# Patient Record
Sex: Female | Born: 1960 | Race: White | Hispanic: No | Marital: Single | Smoking: Former smoker
Health system: Southern US, Community
[De-identification: ages and names within clinical notes are randomized; demographics above are authoritative.]

## PROBLEM LIST (undated history)

## (undated) DIAGNOSIS — F419 Anxiety disorder, unspecified: Secondary | ICD-10-CM

## (undated) DIAGNOSIS — T7840XA Allergy, unspecified, initial encounter: Secondary | ICD-10-CM

## (undated) DIAGNOSIS — J069 Acute upper respiratory infection, unspecified: Secondary | ICD-10-CM

## (undated) HISTORY — DX: Anxiety disorder, unspecified: F41.9

## (undated) HISTORY — DX: Allergy, unspecified, initial encounter: T78.40XA

## (undated) HISTORY — PX: NASAL SINUS SURGERY: SHX719

## (undated) HISTORY — DX: Acute upper respiratory infection, unspecified: J06.9

---

## 1998-09-12 ENCOUNTER — Ambulatory Visit (HOSPITAL_COMMUNITY): Admission: RE | Admit: 1998-09-12 | Discharge: 1998-09-12 | Payer: Self-pay | Admitting: *Deleted

## 1999-10-24 ENCOUNTER — Other Ambulatory Visit: Admission: RE | Admit: 1999-10-24 | Discharge: 1999-10-24 | Payer: Self-pay | Admitting: Obstetrics and Gynecology

## 2001-03-15 ENCOUNTER — Other Ambulatory Visit: Admission: RE | Admit: 2001-03-15 | Discharge: 2001-03-15 | Payer: Self-pay | Admitting: Obstetrics and Gynecology

## 2012-07-28 ENCOUNTER — Encounter (HOSPITAL_BASED_OUTPATIENT_CLINIC_OR_DEPARTMENT_OTHER): Payer: Self-pay | Admitting: *Deleted

## 2012-07-28 ENCOUNTER — Emergency Department (HOSPITAL_BASED_OUTPATIENT_CLINIC_OR_DEPARTMENT_OTHER)
Admission: EM | Admit: 2012-07-28 | Discharge: 2012-07-29 | Disposition: A | Discharge status: Home / Self Care | Payer: BC Managed Care – PPO | Attending: Emergency Medicine | Admitting: Emergency Medicine

## 2012-07-28 DIAGNOSIS — F17200 Nicotine dependence, unspecified, uncomplicated: Secondary | ICD-10-CM | POA: Insufficient documentation

## 2012-07-28 DIAGNOSIS — IMO0001 Reserved for inherently not codable concepts without codable children: Secondary | ICD-10-CM | POA: Insufficient documentation

## 2012-07-28 DIAGNOSIS — J209 Acute bronchitis, unspecified: Secondary | ICD-10-CM

## 2012-07-28 NOTE — ED Notes (Signed)
Pt c/o URi symptoms x 1 week  

## 2012-07-29 ENCOUNTER — Emergency Department (HOSPITAL_BASED_OUTPATIENT_CLINIC_OR_DEPARTMENT_OTHER): Payer: BC Managed Care – PPO

## 2012-07-29 MED ORDER — AZITHROMYCIN 250 MG PO TABS: 250.0000 mg | ORAL_TABLET | Freq: Every day | ORAL | Status: DC

## 2012-07-29 MED ORDER — HYDROCOD POLST-CHLORPHEN POLST 10-8 MG/5ML PO LQCR: 5.0000 mL | Freq: Two times a day (BID) | ORAL | Status: DC | PRN

## 2012-07-29 NOTE — ED Provider Notes (Signed)
History     CSN: [id number]  Arrival date & time 07/28/12  2329   First MD Initiated Contact with Patient 07/29/12 0034      Chief Complaint  Patient presents with  . URI    (Consider location/radiation/quality/duration/timing/severity/associated sxs/prior treatment) Patient is a 52 y.o. female presenting with URI. The history is provided by the patient.  URI The primary symptoms include fatigue, ear pain, cough and myalgias. Primary symptoms do not include fever. The current episode started more than 1 week ago. This is a new problem. The problem has been gradually worsening.  The onset of the illness is associated with exposure to sick contacts (Patient is a Engineer, site with lots of students ill). Risk factors: quit smoking three weeks ago.    History reviewed. No pertinent past medical history.  Past Surgical History  Procedure Date  . Cesarean section     History reviewed. No pertinent family history.  History  Substance Use Topics  . Smoking status: Current Every Day Smoker  . Smokeless tobacco: Not on file  . Alcohol Use: No    OB History    Grav Para Term Preterm Abortions TAB SAB Ect Mult Living                  Review of Systems  Constitutional: Positive for fatigue. Negative for fever.  HENT: Positive for ear pain.   Respiratory: Positive for cough.   Musculoskeletal: Positive for myalgias.  All other systems reviewed and are negative.    Allergies  Review of patient's allergies indicates no known allergies.  Home Medications  No current outpatient prescriptions on file.  BP 137/81  Pulse 88  Temp 98.1 F (36.7 C)  Resp 16  Ht 4\' 10"  (1.473 m)  Wt 98 lb (44.453 kg)  BMI 20.48 kg/m2  SpO2 100%  Physical Exam  Nursing note and vitals reviewed. Constitutional: She is oriented to person, place, and time. She appears well-developed and well-nourished. No distress.  HENT:  Head: Normocephalic and atraumatic.  Mouth/Throat: Oropharynx is  clear and moist.       Bilateral tm's are clear.  Neck: Normal range of motion. Neck supple.  Cardiovascular: Normal rate and regular rhythm.  Exam reveals no gallop and no friction rub.   No murmur heard. Pulmonary/Chest: Effort normal and breath sounds normal. No respiratory distress. She has no wheezes.  Abdominal: Soft. Bowel sounds are normal. She exhibits no distension. There is no tenderness.  Musculoskeletal: Normal range of motion.  Neurological: She is alert and oriented to person, place, and time.  Skin: Skin is warm and dry. She is not diaphoretic.    ED Course  Procedures (including critical care time)  Labs Reviewed - No data to display No results found.   No diagnosis found.    MDM  The patient has been sick for over a week and is getting worse with productive cough, fevers, and shortness of breath.  She is a smoker who stopped weeks ago.  The chest xray is not suggestive of pneumonia.  She will be given cough syrup and zithromax for what sounds like bronchitis.  She is to return prn for any problems.  She was advised to continue to refrain from smoking.        Geoffery Lyons, MD 07/29/12 [phone]

## 2012-07-29 NOTE — ED Notes (Signed)
MD at bedside giving test results and plan of care. 

## 2013-01-25 ENCOUNTER — Ambulatory Visit (INDEPENDENT_AMBULATORY_CARE_PROVIDER_SITE_OTHER): Payer: BC Managed Care – PPO | Admitting: Physician Assistant

## 2013-01-25 VITALS — BP 86/70 | HR 59 | Temp 98.1°F | Resp 18 | Wt 107.0 lb

## 2013-01-25 DIAGNOSIS — H6983 Other specified disorders of Eustachian tube, bilateral: Secondary | ICD-10-CM

## 2013-01-25 DIAGNOSIS — H6980 Other specified disorders of Eustachian tube, unspecified ear: Secondary | ICD-10-CM

## 2013-01-25 DIAGNOSIS — H9209 Otalgia, unspecified ear: Secondary | ICD-10-CM

## 2013-01-25 DIAGNOSIS — H6993 Unspecified Eustachian tube disorder, bilateral: Secondary | ICD-10-CM

## 2013-01-25 DIAGNOSIS — H9203 Otalgia, bilateral: Secondary | ICD-10-CM

## 2013-01-25 DIAGNOSIS — H6590 Unspecified nonsuppurative otitis media, unspecified ear: Secondary | ICD-10-CM

## 2013-01-25 MED ORDER — PREDNISONE 20 MG PO TABS
ORAL_TABLET | ORAL | Status: DC
Start: 2013-01-25 — End: 2013-10-12

## 2013-01-25 MED ORDER — BENZONATATE 100 MG PO CAPS: 100.0000 mg | ORAL_CAPSULE | Freq: Three times a day (TID) | ORAL | Status: DC | PRN

## 2013-01-25 MED ORDER — AMOXICILLIN 875 MG PO TABS: 875.0000 mg | ORAL_TABLET | Freq: Two times a day (BID) | ORAL | Status: DC

## 2013-01-25 MED ORDER — IPRATROPIUM BROMIDE 0.06 % NA SOLN: 2.0000 | Freq: Three times a day (TID) | NASAL | Status: DC

## 2013-01-25 NOTE — Progress Notes (Signed)
   Patient ID: Jessica Bailey MRN: [id number], DOB: [birth year], 52 y.o. Date of Encounter: 01/25/2013, 1:27 PM  Primary Physician: Marisue Brooklyn, DO  Chief Complaint: URI x 1 week  HPI: 52 y.o. female with history below presents with URI symptoms x 1 week. Patient was at the beach 1 week ago and playing in the ocean a lot. Since then she feel like she has "a brick" in her ears. Increasing fluid in her ears. Nasal congestion, post nasal drip, and non productive cough from the drainage. Some sinus pressure. Afebrile. Some dizziness. Has tried Claritin and pseudofed without relief.    Past Medical History  Diagnosis Date  . Allergy      Home Meds: Prior to Admission medications   Medication Sig Start Date End Date Taking? Authorizing Provider                                Allergies:  Allergies  Allergen Reactions  . Codeine     History   Social History  . Marital Status: Married    Spouse Name: N/A    Number of Children: N/A  . Years of Education: N/A   Occupational History  . Not on file.   Social History Main Topics  . Smoking status: Former Games developer  . Smokeless tobacco: Not on file  . Alcohol Use: Yes  . Drug Use: No  . Sexually Active: Yes    Birth Control/ Protection: None   Other Topics Concern  . Not on file   Social History Narrative  . No narrative on file     Review of Systems: Constitutional: negative for chills, fever, or fatigue  HEENT: see above Cardiovascular: negative for chest pain or palpitations Respiratory: positive for cough. negative for wheezing, shortness of breath, or cough Abdominal: negative for abdominal pain, nausea, vomiting, or diarrhea Dermatological: negative for rash Neurologic: positive for headache and dizziness. Negative for syncope   Physical Exam: Blood pressure 86/70, pulse 59, temperature 98.1 F (36.7 C), temperature source Oral, resp. rate 18, weight 107 lb (48.535 kg), SpO2 100.00%., Body mass index is  22.37 kg/(m^2). General: Well developed, well nourished, in no acute distress. Head: Normocephalic, atraumatic, eyes without discharge, sclera non-icteric, nares are without discharge. Bilateral auditory canals clear, TM's are with increased serous fluid and air fluid levels. No perforation. Tragus TTP. Oral cavity moist, posterior pharynx without exudate, erythema, peritonsillar abscess, or post nasal drip.  Neck: Supple. No thyromegaly. Full ROM. No lymphadenopathy. Lungs: Clear bilaterally to auscultation without wheezes, rales, or rhonchi. Breathing is unlabored. Heart: RRR with S1 S2. No murmurs, rubs, or gallops appreciated. Msk:  Strength and tone normal for age. Extremities/Skin: Warm and dry. No clubbing or cyanosis. No edema. No rashes or suspicious lesions. Neuro: Alert and oriented X 3. Moves all extremities spontaneously. Gait is normal. CNII-XII grossly in tact. Psych:  Responds to questions appropriately with a normal affect.     ASSESSMENT AND PLAN:  52 y.o. female with bilateral otitis media, otalgia, and ETD -Amoxicillin 875 mg 1 po bid #20 no RF -Atrovent NS 0.06% 2 sprays each nare bid prn #1 no RF -Prednisone 20 mg #18 3x3, 2x3, 1x3 no RF -Tessalon Perles 100 mg 1 po tid prn cough #40 no RF    Signed, Eula Listen, PA-C 01/25/2013 1:27 PM

## 2013-01-25 NOTE — Patient Instructions (Addendum)
Barotitis Media Barotitis media is soreness (inflammation) of the area behind the eardrum (middle ear). This occurs when the auditory tube (Eustachian tube) leading from the back of the throat to the eardrum is blocked. When it is blocked air cannot move in and out of the middle ear to equalize pressure changes. These pressure changes come from changes in altitude when:  Flying.  Driving in the mountains.  Diving. Problems are more likely to occur with pressure changes during times when you are congested as from:  Hay fever.  Upper respiratory infection.  A cold. Damage or hearing loss (barotrauma) caused by this may be permanent. HOME CARE INSTRUCTIONS   Use medicines as recommended by your caregiver. Over the counter medicines will help unblock the canal and can help during times of air travel.  Do not put anything into your ears to clean or unplug them. Eardrops will not be helpful.  Do not swim, dive, or fly until your caregiver says it is all right to do so. If these activities are necessary, chewing gum with frequent swallowing may help. It is also helpful to hold your nose and gently blow to pop your ears for equalizing pressure changes. This forces air into the Eustachian tube.  For little ones with problems, give your baby a bottle of water or juice during periods when pressure changes would be anticipated such as during take offs and landings associated with air travel.  Only take over-the-counter or prescription medicines for pain, discomfort, or fever as directed by your caregiver.  A decongestant may be helpful in de-congesting the middle ear and make pressure equalization easier. This can be even more effective if the drops (spray) are delivered with the head lying over the edge of a bed with the head tilted toward the ear on the affected side.  If your caregiver has given you a follow-up appointment, it is very important to keep that appointment. Not keeping the  appointment could result in a chronic or permanent injury, pain, hearing loss and disability. If there is any problem keeping the appointment, you must call back to this facility for assistance. SEEK IMMEDIATE MEDICAL CARE IF:   You develop a severe headache, dizziness, severe ear pain, or bloody or pus-like drainage from your ears.  An oral temperature above 102 F (38.9 C) develops.  Your problems do not improve or become worse. MAKE SURE YOU:   Understand these instructions.  Will watch your condition.  Will get help right away if you are not doing well or get worse. Document Released: 07/04/2000 Document Revised: 09/29/2011 Document Reviewed: 02/10/2008 ExitCare Patient Information 2014 [employer].  

## 2013-10-12 ENCOUNTER — Ambulatory Visit (INDEPENDENT_AMBULATORY_CARE_PROVIDER_SITE_OTHER): Payer: BC Managed Care – PPO | Admitting: Physician Assistant

## 2013-10-12 VITALS — BP 84/68 | HR 65 | Temp 97.9°F | Resp 16 | Ht 60.5 in | Wt 104.6 lb

## 2013-10-12 DIAGNOSIS — H6980 Other specified disorders of Eustachian tube, unspecified ear: Secondary | ICD-10-CM

## 2013-10-12 DIAGNOSIS — J3089 Other allergic rhinitis: Secondary | ICD-10-CM | POA: Insufficient documentation

## 2013-10-12 DIAGNOSIS — Z9109 Other allergy status, other than to drugs and biological substances: Secondary | ICD-10-CM

## 2013-10-12 MED ORDER — AMOXICILLIN 875 MG PO TABS: 1750.0000 mg | ORAL_TABLET | Freq: Two times a day (BID) | ORAL | Status: AC

## 2013-10-12 MED ORDER — PREDNISONE 20 MG PO TABS: ORAL_TABLET | ORAL | Status: DC

## 2013-10-12 MED ORDER — IPRATROPIUM BROMIDE 0.06 % NA SOLN: 2.0000 | Freq: Three times a day (TID) | NASAL | Status: DC

## 2013-10-12 NOTE — Patient Instructions (Signed)
Get plenty of rest and drink at least 64 ounces of water daily.  If your symptoms do not completely resolve, please let me know.  I will plan to refer you to an ENT specialist for additional evaluation and treatment.

## 2013-10-12 NOTE — Progress Notes (Signed)
Subjective:    Patient ID: Jessica Bailey, female    DOB: 04/23/1961, 53 y.o.   MRN: [id number]   PCP: No primary provider on file.  Chief Complaint  Patient presents with  . Sinusitis    1 week    Medications, allergies, past medical history, surgical history, family history, social history and problem list reviewed and updated.  HPI  Feels like she never really got rid of the fluid in her ears after she was seen here this past summer. Having some dizziness. Constant ringing or roaring in the ears, L>R. Some popping as well. Loud sounds exacerbate the discomfort and cause pain. Thinks she may have a sinus infection. Lots of pressure in the forehead and face, increased headaches that don't resolve with Advil, thicker mucous than usual. 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 No ear drainage or pain with movement of the external ear.  Subjective fevers and chills x 2 weeks. No increased cough-just occasional cough, really just a clearing, since the pollen came out. No nausea, vomiting, diarrhea. No unexplained myalgias.  Review of Systems As above.    Objective:   Physical Exam  Vitals reviewed. Constitutional: She is oriented to person, place, and time. Vital signs are normal. She appears well-developed and well-nourished. She is active and cooperative. No distress.  BP 84/68  Pulse 65  Temp(Src) 97.9 F (36.6 C)  Resp 16  Ht 5' 0.5" (1.537 m)  Wt 104 lb 9.6 oz (47.446 kg)  BMI 20.08 kg/m2  SpO2 100%  HENT:  Head: Normocephalic and atraumatic.  Right Ear: Hearing normal.  Left Ear: Hearing normal.  Eyes: Conjunctivae are normal. No scleral icterus.  Neck: Normal range of motion. Neck supple. No thyromegaly present.  Cardiovascular: Normal rate, regular rhythm and normal heart sounds.   Pulses:      Radial pulses are 2+ on the right side, and 2+ on the left side.  Pulmonary/Chest: Effort normal and breath sounds normal.    Lymphadenopathy:       Head (right side): No tonsillar, no preauricular, no posterior auricular and no occipital adenopathy present.       Head (left side): No tonsillar, no preauricular, no posterior auricular and no occipital adenopathy present.    She has no cervical adenopathy.       Right: No supraclavicular adenopathy present.       Left: No supraclavicular adenopathy present.  Neurological: She is alert and oriented to person, place, and time. No sensory deficit.  Skin: Skin is warm, dry and intact. No rash noted. No cyanosis or erythema. Nails show no clubbing.  Psychiatric: She has a normal mood and affect.          Assessment & Plan:  1. Chronic eustachian tube dysfunction Considering subacute sinusitis. If symptoms persist, she will contact me. I will plan to refer to ENT for additional evaluation. - amoxicillin (AMOXIL) 875 MG tablet; Take 2 tablets (1,750 mg total) by mouth 2 (two) times daily.  Dispense: 20 tablet; Refill: 0 - predniSONE (DELTASONE) 20 MG tablet; 3 PO FOR 3 DAYS, 2 PO FOR 3 DAYS, 1 PO FOR 3 DAYS  Dispense: 18 tablet; Refill: 0 - ipratropium (ATROVENT) 0.06 % nasal spray; Place 2 sprays into the nose 3 (three) times daily.  Dispense: 15 mL; Refill: 0  2. Environmental allergies Continue oral antihistamine and Flonase.   Fernande Brashelle S. Mcguire Gasparyan, PA-C Physician Assistant-Certified [facility]

## 2014-03-15 ENCOUNTER — Other Ambulatory Visit: Payer: Self-pay | Admitting: Obstetrics and Gynecology

## 2014-03-15 DIAGNOSIS — N63 Unspecified lump in unspecified breast: Secondary | ICD-10-CM

## 2014-03-15 LAB — HM PAP SMEAR: HM PAP: NEGATIVE

## 2014-03-24 ENCOUNTER — Ambulatory Visit
Admission: RE | Admit: 2014-03-24 | Discharge: 2014-03-24 | Disposition: A | Discharge status: Home / Self Care | Payer: BC Managed Care – PPO | Source: Ambulatory Visit | Attending: Obstetrics and Gynecology | Admitting: Obstetrics and Gynecology

## 2014-03-24 DIAGNOSIS — N63 Unspecified lump in unspecified breast: Secondary | ICD-10-CM

## 2014-10-31 ENCOUNTER — Ambulatory Visit (INDEPENDENT_AMBULATORY_CARE_PROVIDER_SITE_OTHER): Payer: BC Managed Care – PPO | Admitting: Sports Medicine

## 2014-10-31 VITALS — BP 110/68 | HR 71 | Temp 97.9°F | Resp 16 | Ht 60.05 in | Wt 102.8 lb

## 2014-10-31 DIAGNOSIS — J0100 Acute maxillary sinusitis, unspecified: Secondary | ICD-10-CM

## 2014-10-31 DIAGNOSIS — H6982 Other specified disorders of Eustachian tube, left ear: Secondary | ICD-10-CM

## 2014-10-31 MED ORDER — AMOXICILLIN 875 MG PO TABS: 875.0000 mg | ORAL_TABLET | Freq: Two times a day (BID) | ORAL | Status: DC

## 2014-10-31 MED ORDER — PREDNISONE 20 MG PO TABS: ORAL_TABLET | ORAL | Status: DC

## 2014-10-31 NOTE — Progress Notes (Addendum)
   Subjective:    Patient ID: Jessica Bailey, female    DOB: June 16, 1961, 54 y.o.   MRN: [id number]  HPI [name] is a 54 year old female who presents with left ear popping and sinus congestion. Symptoms have been worsening over the past 7-10 days. She describes bilateral sinus pressure and rhinitis. Occasionally because she has been blowing her nose a lot she notices a small amount of blood when she blows her nose. She recently started Flonase as well. She describes bilateral maxillary sinus pressure and a frontal headache. She has a history of chronic seasonal allergies and left ear eustachian tube dysfunction. She denies any recent antibiotic use. Of note, she was seen just over one year ago with very similar symptoms which improved with use of an oral antibiotic, nasal steroid, and oral prednisone. She denies any recent travel. 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 She notes some mild chills and subjective fever. She denies any body aches, wheezing, or shortness of breath. She says that she has a dry cough as well. She feels that she has postnasal drip that tickles the back of her throat, causing her to cough. Besides taking Sudafed and starting the Flonase, she has not tried anything else.  Past medical history, social history, medications, and allergies were reviewed and are up to date in the chart.  Review of Systems 7 point review of systems was performed and was otherwise negative unless noted in the history of present illness.     Objective:   Physical Exam BP 110/68 mmHg  Pulse 71  Temp(Src) 97.9 F (36.6 C) (Oral)  Resp 16  Ht 5' 0.05" (1.525 m)  Wt 102 lb 12.8 oz (46.63 kg)  BMI 20.05 kg/m2  SpO2 97% General appearance: alert, cooperative and appears stated age Head: Normocephalic, without obvious abnormality, atraumatic Eyes: conjunctivae/corneas clear. PERRL, EOM's intact. Fundi benign. Ears: Serous fluid  behind left TM, no bulging or erythema. No purulence. Nose: Boggy, edematous congestion, right nare with scant blood, pale bluish coloration. No polyps seen. Throat: post-nasal drip. No tonsillar exudate or uvular deviation. Neck: mild anterior cervical adenopathy, no carotid bruit, no JVD, supple, symmetrical, trachea midline and thyroid not enlarged, symmetric, no tenderness/mass/nodules Lungs: clear to auscultation bilaterally Heart: regular rate and rhythm, S1, S2 normal, no murmur, click, rub or gallop Pulses: 2+ and symmetric Skin: Skin color, texture, turgor normal. No rashes or lesions Lymph nodes: Cervical adenopathy: mild anterior cervical LAD    Assessment & Plan:  1. Allergic rhinitis, seasonal triggers 2. Maxillary sinusitis  -Rx amoxicillin 875mg , 2 tabs po bid x 10 days -Rx prednisone taper -Continue flonase -Nasal saline rinses -Continue loratidine -If noticing increasing cough, fever >102, shortness of breath, wheezing, lethargy, or for any other concerns, then return to the clinic or go to the emergency department. Patient verbalized understanding and agreement.  Dr. Joellyn HaffPick-Jacobs, DO Sports Medicine Fellow  I have participated in the care of this patient with the sports medicine fellow and agree with Diagnosis and Plan as documented. Robert P. Merla Richesoolittle, M.D.

## 2014-10-31 NOTE — Patient Instructions (Signed)

## 2015-07-09 ENCOUNTER — Ambulatory Visit (INDEPENDENT_AMBULATORY_CARE_PROVIDER_SITE_OTHER): Payer: BC Managed Care – PPO | Admitting: Internal Medicine

## 2015-07-09 VITALS — BP 98/60 | HR 66 | Temp 98.3°F | Resp 16 | Ht 60.0 in | Wt 104.8 lb

## 2015-07-09 DIAGNOSIS — J0100 Acute maxillary sinusitis, unspecified: Secondary | ICD-10-CM

## 2015-07-09 MED ORDER — AMOXICILLIN 875 MG PO TABS: 875.0000 mg | ORAL_TABLET | Freq: Two times a day (BID) | ORAL | Status: DC

## 2015-07-09 NOTE — Progress Notes (Signed)
Subjective:  By signing my name below, I, Raven Small, attest that this documentation has been prepared under the direction and in the presence of Ellamae Sia, MD.  Electronically Signed: Andrew Au, ED Scribe. 07/09/2015. 5:05 PM.   Patient ID: Jessica Bailey, female    DOB: March 29, 1961, 54 y.o.   MRN: [id number]  HPI Chief Complaint  Patient presents with  . Sinusitis  . Ear Pain    left ear   HPI Comments: Jessica Bailey is a 54 y.o. female who presents to the [facility] complaining of sinus congestion for 3-4 days with associated sinus pressure, worse when bending forward and left otalgia.  She has been taking OTC medication without relief to symptoms. 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 She denies fever chills and cough. Started w/ uri 2 weeks ago and never cleared.  Patient Active Problem List   Diagnosis Date Noted  . Environmental allergies 10/12/2013   Past Medical History  Diagnosis Date  . Allergy    Past Surgical History  Procedure Laterality Date  . Cesarean section    . Nasal sinus surgery     Allergies  Allergen Reactions  . Codeine    Prior to Admission medications   Medication Sig Start Date End Date Taking? Authorizing Provider  loratadine (CLARITIN) 10 MG tablet Take 10 mg by mouth daily.   Yes Historical Provider, MD  pseudoephedrine (SUDAFED) 30 MG tablet Take 30 mg by mouth daily.   Yes Historical Provider, MD  fluticasone (FLONASE) 50 MCG/ACT nasal spray Place 2 sprays into both nostrils daily. Reported on 07/09/2015    Historical Provider, MD  predniSONE (DELTASONE) 20 MG tablet 3 PO FOR 3 DAYS, 2 PO FOR 3 DAYS, 1 PO FOR 3 DAYS Patient not taking: Reported on 07/09/2015 10/31/14   Danelle Berry, DO   Social History   Social History  . Marital Status: Divorced    Spouse Name: n/a  . Number of Children: 2  . Years of Education: N/A   Occupational History  . 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 Smoking status: Former Smoker -- 2 years  . Smokeless tobacco: Never Used  . Alcohol Use: 0.6 oz/week    1 Glasses of wine per week  . Drug Use: No  . Sexual Activity: Yes    Birth Control/ Protection: Post-menopausal   Other Topics Concern  . Not on file   Social History Narrative   Lives with her two children.   Review of Systems  Constitutional: Negative for fever and chills.  HENT: Positive for congestion, ear pain and sinus pressure.   Respiratory: Negative for cough, shortness of breath and wheezing.     Objective:   Physical Exam  Constitutional: She is oriented to person, place, and time. She appears well-developed and well-nourished. No distress.  HENT:  Head: Normocephalic and atraumatic.  Right Ear: External ear normal.  Left Ear: External ear normal.  Nose: Right sinus exhibits maxillary sinus tenderness. Left sinus exhibits maxillary sinus tenderness.  Mouth/Throat: Oropharynx is clear and moist. No oropharyngeal exudate.  Purulent nasal drainage  Eyes: Conjunctivae and EOM are normal. Pupils are equal, round, and reactive to light.  Neck: Neck supple.  Cardiovascular: Normal rate, regular rhythm and normal heart sounds.   No murmur heard. Pulmonary/Chest: Effort normal and breath sounds normal. She has no wheezes. She has no rales.  Musculoskeletal: Normal  range of motion.  Lymphadenopathy:    She has no cervical adenopathy.  Neurological: She is alert and oriented to person, place, and time.  Skin: Skin is warm and dry.  Psychiatric: She has a normal mood and affect. Her behavior is normal.  Nursing note and vitals reviewed.    Filed Vitals:   07/09/15 1628  BP: 98/60  Pulse: 66  Temp: 98.3 F (36.8 C)  TempSrc: Oral  Resp: 16  Height: 5' (1.524 m)  Weight: 104 lb 12.8 oz (47.537 kg)  SpO2: 97%   Assessment & Plan:   1. Acute maxillary sinusitis, recurrence not specified     Meds ordered this encounter    Medications  . amoxicillin (AMOXIL) 875 MG tablet    Sig: Take 1 tablet (875 mg total) by mouth 2 (two) times daily.    Dispense:  20 tablet    Refill:  0  sudafed 3 d  I have completed the patient encounter in its entirety as documented by the scribe, with editing by me where necessary. Turon Kilmer P. Merla Richesoolittle, M.D.

## 2015-10-16 IMAGING — MG MM DIAGNOSTIC BILATERAL
3 series · 3 of 3 positions shown · non-contrast
Comparison: Mammography 09/03/2010.

CLINICAL DATA: Palpable lump in the lower right breast per patient
which she initially noted approximately 6 weeks ago, though she
cannot definitely palpate the lump currently. Annual evaluation,
left breast.

EXAM:
DIGITAL DIAGNOSTIC BILATERAL MAMMOGRAM WITH CAD
ULTRASOUND RIGHT BREAST

[R CC]
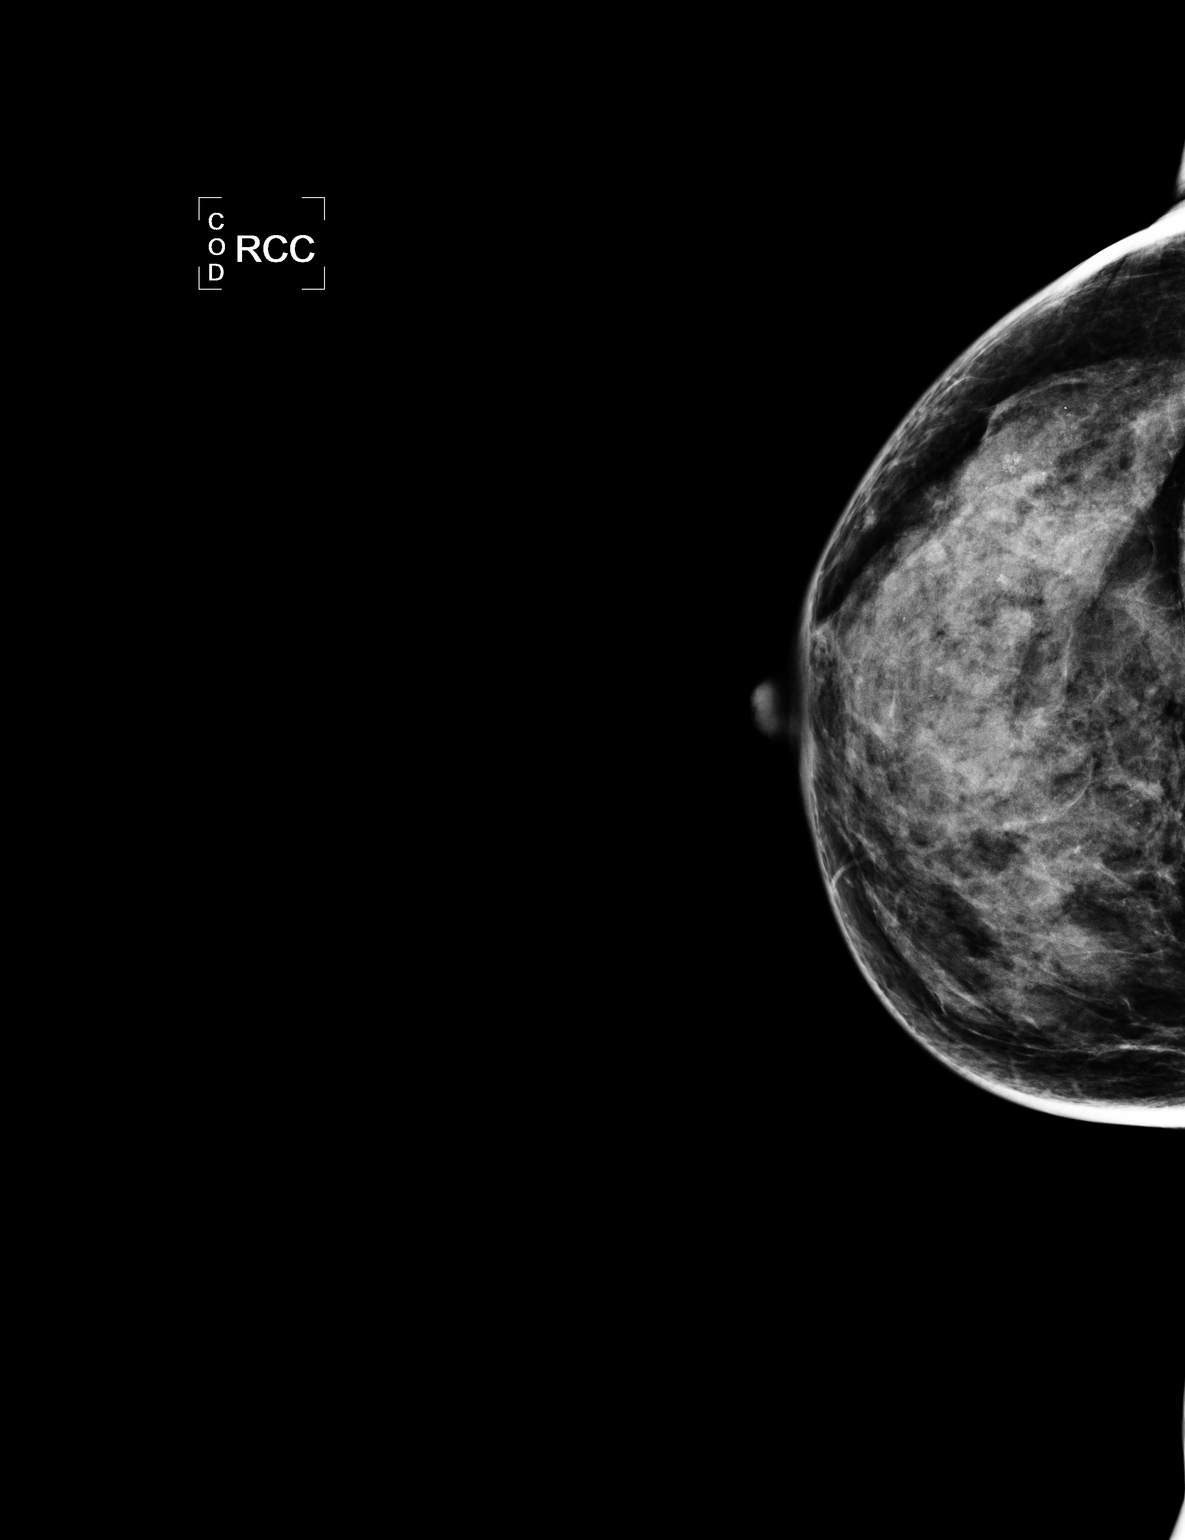

[L CC]
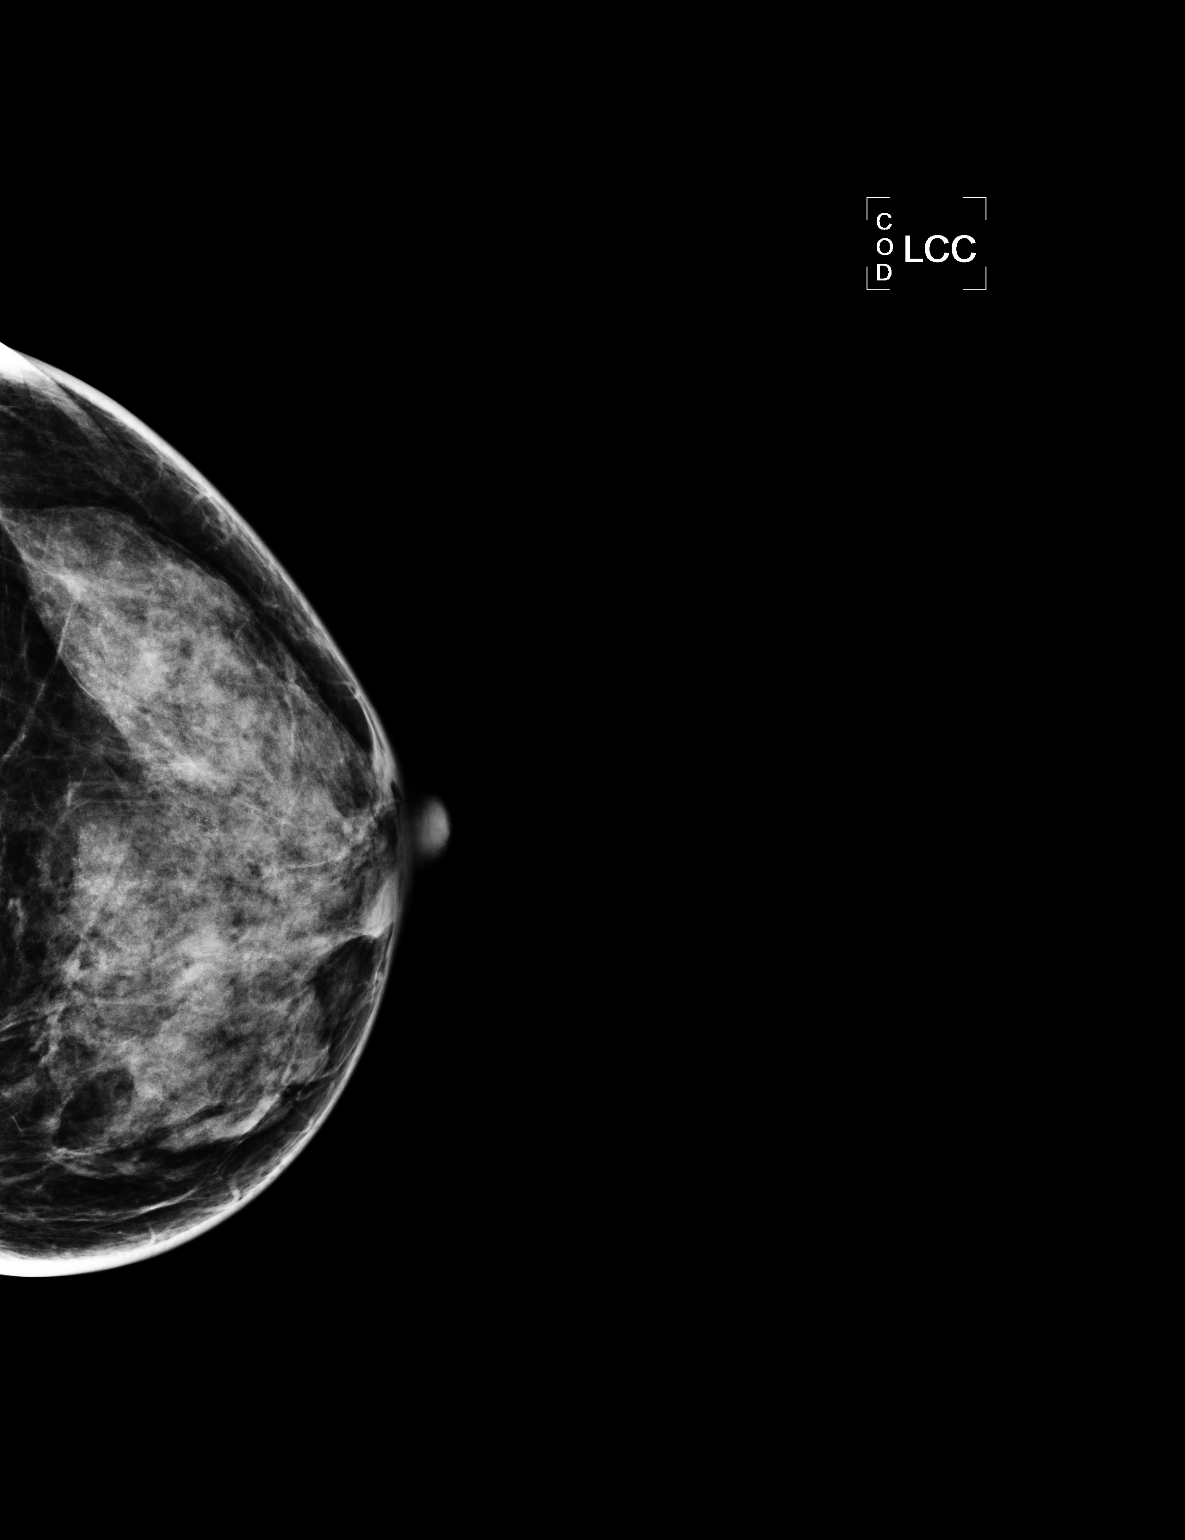

[R MLO]
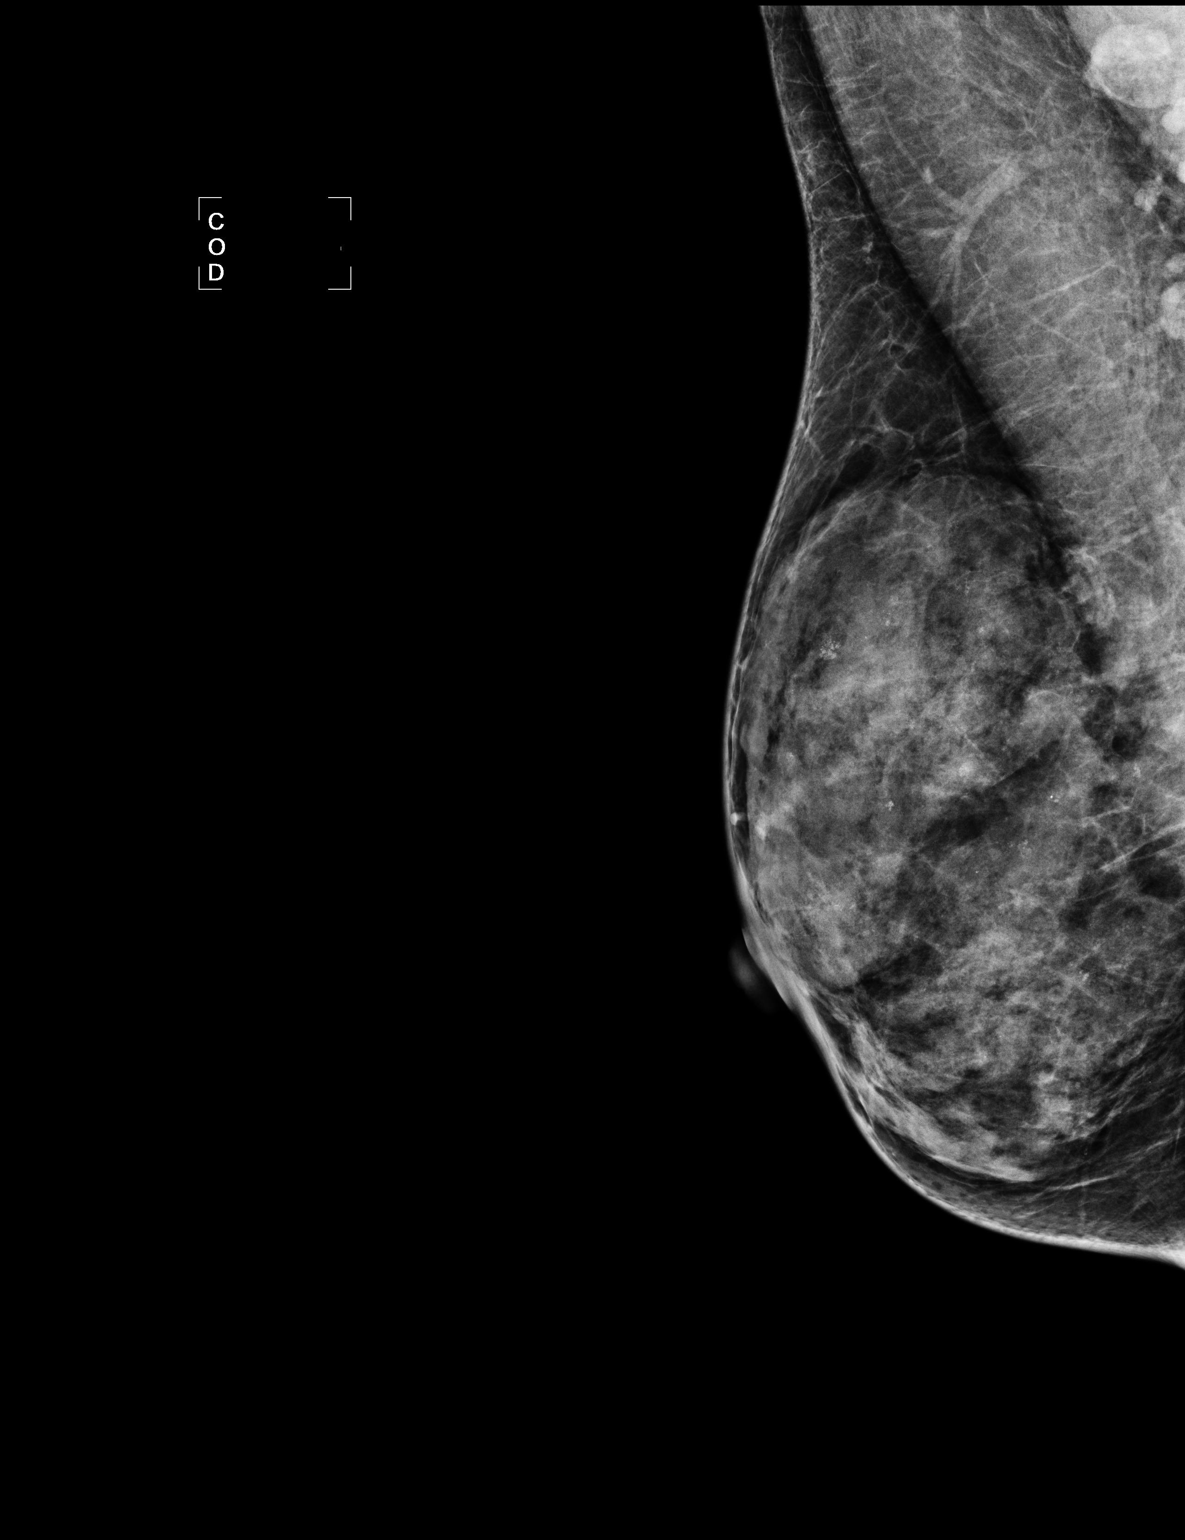

[3 of 3 positions shown; findings below may reference images not displayed]

No prior ultrasound.

ACR Breast Density Category d: The breast tissue is extremely dense,
which lowers the sensitivity of mammography.
FINDINGS: CC and MLO views of both breasts were obtained. No findings
suspicious for malignancy in either breast.

Mammographic images were processed with CAD.

On physical exam, there is no palpable abnormality in the lower
right breast.

Ultrasound is performed, showing normal dense fibroglandular tissue
throughout the lower right breast from 3 o'clock through 9 o'clock.
No cyst, solid mass or abnormal acoustic shadowing was identified.
IMPRESSION: 1. No mammographic or sonographic evidence of malignancy, right
breast.
2. No mammographic evidence of malignancy, left breast.

RECOMMENDATION:
Screening mammogram in one year.(Code:[id number]-[id number])

The importance of self breast examination and an annual clinical
breast examination was discussed with the patient.

I have discussed the findings and recommendations with the patient.
Results were also provided in writing at the conclusion of the
visit. If applicable, a reminder letter will be sent to the patient
regarding the next appointment.

BI-RADS CATEGORY  1: Negative.

## 2016-02-03 ENCOUNTER — Emergency Department (HOSPITAL_COMMUNITY): Payer: BC Managed Care – PPO

## 2016-02-03 ENCOUNTER — Emergency Department (HOSPITAL_COMMUNITY)
Admission: EM | Admit: 2016-02-03 | Discharge: 2016-02-03 | Disposition: A | Discharge status: Home / Self Care | Payer: BC Managed Care – PPO | Attending: Emergency Medicine | Admitting: Emergency Medicine

## 2016-02-03 ENCOUNTER — Encounter (HOSPITAL_COMMUNITY): Payer: Self-pay | Admitting: *Deleted

## 2016-02-03 DIAGNOSIS — Z87891 Personal history of nicotine dependence: Secondary | ICD-10-CM | POA: Diagnosis not present

## 2016-02-03 DIAGNOSIS — Z79899 Other long term (current) drug therapy: Secondary | ICD-10-CM | POA: Insufficient documentation

## 2016-02-03 DIAGNOSIS — R0789 Other chest pain: Secondary | ICD-10-CM

## 2016-02-03 DIAGNOSIS — F419 Anxiety disorder, unspecified: Secondary | ICD-10-CM | POA: Diagnosis not present

## 2016-02-03 DIAGNOSIS — R1030 Lower abdominal pain, unspecified: Secondary | ICD-10-CM | POA: Insufficient documentation

## 2016-02-03 LAB — I-STAT TROPONIN, ED
TROPONIN I, POC: 0 ng/mL (ref 0.00–0.08)
Troponin i, poc: 0 ng/mL (ref 0.00–0.08)

## 2016-02-03 LAB — CBC WITH DIFFERENTIAL/PLATELET
BASOS ABS: 0 10*3/uL (ref 0.0–0.1)
BASOS PCT: 1 %
EOS ABS: 0.1 10*3/uL (ref 0.0–0.7)
EOS PCT: 2 %
HEMATOCRIT: 37.6 % (ref 36.0–46.0)
Hemoglobin: 13.1 g/dL (ref 12.0–15.0)
Lymphocytes Relative: 35 %
Lymphs Abs: 1.7 10*3/uL (ref 0.7–4.0)
MCH: 28.7 pg (ref 26.0–34.0)
MCHC: 34.8 g/dL (ref 30.0–36.0)
MCV: 82.5 fL (ref 78.0–100.0)
MONO ABS: 0.3 10*3/uL (ref 0.1–1.0)
MONOS PCT: 7 %
NEUTROS ABS: 2.7 10*3/uL (ref 1.7–7.7)
Neutrophils Relative %: 55 %
PLATELETS: 243 10*3/uL (ref 150–400)
RBC: 4.56 MIL/uL (ref 3.87–5.11)
RDW: 12.1 % (ref 11.5–15.5)
WBC: 4.9 10*3/uL (ref 4.0–10.5)

## 2016-02-03 LAB — COMPREHENSIVE METABOLIC PANEL
ALBUMIN: 3.8 g/dL (ref 3.5–5.0)
ALT: 19 U/L (ref 14–54)
ANION GAP: 7 (ref 5–15)
AST: 28 U/L (ref 15–41)
Alkaline Phosphatase: 61 U/L (ref 38–126)
BUN: 12 mg/dL (ref 6–20)
CHLORIDE: 106 mmol/L (ref 101–111)
CO2: 24 mmol/L (ref 22–32)
Calcium: 9.1 mg/dL (ref 8.9–10.3)
Creatinine, Ser: 0.74 mg/dL (ref 0.44–1.00)
GFR calc Af Amer: >60 mL/min (ref >60)
GFR calc non Af Amer: >60 mL/min (ref >60)
GLUCOSE: 106 mg/dL — AB (ref 65–99)
POTASSIUM: 3.4 mmol/L — AB (ref 3.5–5.1)
SODIUM: 137 mmol/L (ref 135–145)
Total Bilirubin: 0.8 mg/dL (ref 0.3–1.2)
Total Protein: 6.8 g/dL (ref 6.5–8.1)

## 2016-02-03 LAB — I-STAT CHEM 8, ED
BUN: 10 mg/dL (ref 6–20)
CALCIUM ION: 1.15 mmol/L (ref 1.13–1.30)
CHLORIDE: 104 mmol/L (ref 101–111)
Creatinine, Ser: 0.6 mg/dL (ref 0.44–1.00)
Glucose, Bld: 88 mg/dL (ref 65–99)
HCT: 41 % (ref 36.0–46.0)
Hemoglobin: 13.9 g/dL (ref 12.0–15.0)
Potassium: 3.8 mmol/L (ref 3.5–5.1)
SODIUM: 142 mmol/L (ref 135–145)
TCO2: 24 mmol/L (ref 0–100)

## 2016-02-03 LAB — URINALYSIS, ROUTINE W REFLEX MICROSCOPIC
BILIRUBIN URINE: NEGATIVE
GLUCOSE, UA: NEGATIVE mg/dL
HGB URINE DIPSTICK: NEGATIVE
Ketones, ur: NEGATIVE mg/dL
Leukocytes, UA: NEGATIVE
Nitrite: NEGATIVE
PROTEIN: NEGATIVE mg/dL
SPECIFIC GRAVITY, URINE: 1.007 (ref 1.005–1.030)
pH: 7 (ref 5.0–8.0)

## 2016-02-03 LAB — WET PREP, GENITAL
CLUE CELLS WET PREP: NONE SEEN
SPERM: NONE SEEN
TRICH WET PREP: NONE SEEN
YEAST WET PREP: NONE SEEN

## 2016-02-03 MED ORDER — TRAMADOL HCL 50 MG PO TABS: 50.0000 mg | ORAL_TABLET | Freq: Four times a day (QID) | ORAL | Status: DC | PRN

## 2016-02-03 MED ORDER — FAMOTIDINE 20 MG PO TABS: 20.0000 mg | ORAL_TABLET | Freq: Two times a day (BID) | ORAL | Status: DC

## 2016-02-03 MED ORDER — ASPIRIN 81 MG PO CHEW: 81.0000 mg | CHEWABLE_TABLET | Freq: Every day | ORAL | Status: AC

## 2016-02-03 NOTE — ED Notes (Signed)
Patient transported to X-ray 

## 2016-02-03 NOTE — ED Notes (Signed)
Pt departed in NAD, refused use of wheelchair.  

## 2016-02-03 NOTE — Discharge Instructions (Signed)
We saw you in the ER for chest tightness and abdominal pain. All the results in the ER are normal, labs and imaging. We are not sure what is causing your symptoms. The workup in the ER is not complete, and is limited to screening for life threatening and emergent conditions only, so please see a primary care doctor for further evaluation.  Please return to the ER if you have worsening chest pain, abdominal pain, fevers, shortness of breath, pain radiating to your jaw, shoulder, or back, sweats or fainting. Otherwise see the Cardiologist or your primary care doctor as requested.  START TAKING OVER THE COUNTER OMEPRAZOLE, PRILOSEC OR ZANTAC.   Nonspecific Chest Pain  Chest pain can be caused by many different conditions. There is always a chance that your pain could be related to something serious, such as a heart attack or a blood clot in your lungs. Chest pain can also be caused by conditions that are not life-threatening. If you have chest pain, it is very important to follow up with your health care provider. CAUSES  Chest pain can be caused by:  Heartburn.  Pneumonia or bronchitis.  Anxiety or stress.  Inflammation around your heart (pericarditis) or lung (pleuritis or pleurisy).  A blood clot in your lung.  A collapsed lung (pneumothorax). It can develop suddenly on its own (spontaneous pneumothorax) or from trauma to the chest.  Shingles infection (varicella-zoster virus).  Heart attack.  Damage to the bones, muscles, and cartilage that make up your chest wall. This can include:  Bruised bones due to injury.  Strained muscles or cartilage due to frequent or repeated coughing or overwork.  Fracture to one or more ribs.  Sore cartilage due to inflammation (costochondritis). RISK FACTORS  Risk factors for chest pain may include:  Activities that increase your risk for trauma or injury to your chest.  Respiratory infections or conditions that cause frequent  coughing.  Medical conditions or overeating that can cause heartburn.  Heart disease or family history of heart disease.  Conditions or health behaviors that increase your risk of developing a blood clot.  Having had chicken pox (varicella zoster). SIGNS AND SYMPTOMS Chest pain can feel like:  Burning or tingling on the surface of your chest or deep in your chest.  Crushing, pressure, aching, or squeezing pain.  Dull or sharp pain that is worse when you move, cough, or take a deep breath.  Pain that is also felt in your back, neck, shoulder, or arm, or pain that spreads to any of these areas. Your chest pain may come and go, or it may stay constant. DIAGNOSIS Lab tests or other studies may be needed to find the cause of your pain. Your health care provider may have you take a test called an ambulatory ECG (electrocardiogram). An ECG records your heartbeat patterns at the time the test is performed. You may also have other tests, such as:  Transthoracic echocardiogram (TTE). During echocardiography, sound waves are used to create a picture of all of the heart structures and to look at how blood flows through your heart.  Transesophageal echocardiogram (TEE).This is a more advanced imaging test that obtains images from inside your body. It allows your health care provider to see your heart in finer detail.  Cardiac monitoring. This allows your health care provider to monitor your heart rate and rhythm in real time.  Holter monitor. This is a portable device that records your heartbeat and can help to diagnose abnormal heartbeats. It  allows your health care provider to track your heart activity for several days, if needed.  Stress tests. These can be done through exercise or by taking medicine that makes your heart beat more quickly.  Blood tests.  Imaging tests. TREATMENT  Your treatment depends on what is causing your chest pain. Treatment may include:  Medicines. These may  include:  Acid blockers for heartburn.  Anti-inflammatory medicine.  Pain medicine for inflammatory conditions.  Antibiotic medicine, if an infection is present.  Medicines to dissolve blood clots.  Medicines to treat coronary artery disease.  Supportive care for conditions that do not require medicines. This may include:  Resting.  Applying heat or cold packs to injured areas.  Limiting activities until pain decreases. HOME CARE INSTRUCTIONS  If you were prescribed an antibiotic medicine, finish it all even if you start to feel better.  Avoid any activities that bring on chest pain.  Do not use any tobacco products, including cigarettes, chewing tobacco, or electronic cigarettes. If you need help quitting, ask your health care provider.  Do not drink alcohol.  Take medicines only as directed by your health care provider.  Keep all follow-up visits as directed by your health care provider. This is important. This includes any further testing if your chest pain does not go away.  If heartburn is the cause for your chest pain, you may be told to keep your head raised (elevated) while sleeping. This reduces the chance that acid will go from your stomach into your esophagus.  Make lifestyle changes as directed by your health care provider. These may include:  Getting regular exercise. Ask your health care provider to suggest some activities that are safe for you.  Eating a heart-healthy diet. A registered dietitian can help you to learn healthy eating options.  Maintaining a healthy weight.  Managing diabetes, if necessary.  Reducing stress. SEEK MEDICAL CARE IF:  Your chest pain does not go away after treatment.  You have a rash with blisters on your chest.  You have a fever. SEEK IMMEDIATE MEDICAL CARE IF:   Your chest pain is worse.  You have an increasing cough, or you cough up blood.  You have severe abdominal pain.  You have severe weakness.  You  faint.  You have chills.  You have sudden, unexplained chest discomfort.  You have sudden, unexplained discomfort in your arms, back, neck, or jaw.  You have shortness of breath at any time.  You suddenly start to sweat, or your skin gets clammy.  You feel nauseous or you vomit.  You suddenly feel light-headed or dizzy.  Your heart begins to beat quickly, or it feels like it is skipping beats. These symptoms may represent a serious problem that is an emergency. Do not wait to see if the symptoms will go away. Get medical help right away. Call your local emergency services (911 in the [place] Do not drive yourself to the hospital.   This information is not intended to replace advice given to you by your health care provider. Make sure you discuss any questions you have with your health care provider.   Document Released: 04/16/2005 Document Revised: 07/28/2014 Document Reviewed: 02/10/2014 Elsevier Interactive Patient Education 2016 Else[employer] Inc[employer] Pelvic Pain, Female Female pelvic pain can be caused by many different things and start from a variety of places. Pelvic pain refers to pain that is located in the lower half of the abdomen and between your hips. The pain may occur over a short  period of time (acute) or may be reoccurring (chronic). The cause of pelvic pain may be related to disorders affecting the female reproductive organs (gynecologic), but it may also be related to the bladder, kidney stones, an intestinal complication, or muscle or skeletal problems. Getting help right away for pelvic pain is important, especially if there has been severe, sharp, or a sudden onset of unusual pain. It is also important to get help right away because some types of pelvic pain can be life threatening.  CAUSES  Below are only some of the causes of pelvic pain. The causes of pelvic pain can be in one of several categories.   Gynecologic.  Pelvic inflammatory disease.  Sexually transmitted  infection.  Ovarian cyst or a twisted ovarian ligament (ovarian torsion).  Uterine lining that grows outside the uterus (endometriosis).  Fibroids, cysts, or tumors.  Ovulation.  Pregnancy.  Pregnancy that occurs outside the uterus (ectopic pregnancy).  Miscarriage.  Labor.  Abruption of the placenta or ruptured uterus.  Infection.  Uterine infection (endometritis).  Bladder infection.  Diverticulitis.  Miscarriage related to a uterine infection (septic abortion).  Bladder.  Inflammation of the bladder (cystitis).  Kidney stone(s).  Gastrointestinal.  Constipation.  Diverticulitis.  Neurologic.  Trauma.  Feeling pelvic pain because of mental or emotional causes (psychosomatic).  Cancers of the bowel or pelvis. EVALUATION  Your caregiver will want to take a careful history of your concerns. This includes recent changes in your health, a careful gynecologic history of your periods (menses), and a sexual history. Obtaining your family history and medical history is also important. Your caregiver may suggest a pelvic exam. A pelvic exam will help identify the location and severity of the pain. It also helps in the evaluation of which organ system may be involved. In order to identify the cause of the pelvic pain and be properly treated, your caregiver may order tests. These tests may include:   A pregnancy test.  Pelvic ultrasonography.  An X-ray exam of the abdomen.  A urinalysis or evaluation of vaginal discharge.  Blood tests. HOME CARE INSTRUCTIONS   Only take over-the-counter or prescription medicines for pain, discomfort, or fever as directed by your caregiver.   Rest as directed by your caregiver.   Eat a balanced diet.   Drink enough fluids to make your urine clear or pale yellow, or as directed.   Avoid sexual intercourse if it causes pain.   Apply warm or cold compresses to the lower abdomen depending on which one helps the pain.    Avoid stressful situations.   Keep a journal of your pelvic pain. Write down when it started, where the pain is located, and if there are things that seem to be associated with the pain, such as food or your menstrual cycle.  Follow up with your caregiver as directed.  SEEK MEDICAL CARE IF:  Your medicine does not help your pain.  You have abnormal vaginal discharge. SEEK IMMEDIATE MEDICAL CARE IF:   You have heavy bleeding from the vagina.   Your pelvic pain increases.   You feel light-headed or faint.   You have chills.   You have pain with urination or blood in your urine.   You have uncontrolled diarrhea or vomiting.   You have a fever or persistent symptoms for more than 3 days.  You have a fever and your symptoms suddenly get worse.   You are being physically or sexually abused.   This information is not intended to  replace advice given to you by your health care provider. Make sure you discuss any questions you have with your health care provider.   Document Released: 06/03/2004 Document Revised: 03/28/2015 Document Reviewed: 10/27/2011 Elsevier Interactive Patient Education 2016 Elsevier Inc.  Generalized Anxiety Disorder Generalized anxiety disorder (GAD) is a mental disorder. It interferes with life functions, including relationships, work, and school. GAD is different from normal anxiety, which everyone experiences at some point in their lives in response to specific life events and activities. Normal anxiety actually helps [place] prepare for and get through these life events and activities. Normal anxiety goes away after the event or activity is over.  GAD causes anxiety that is not necessarily related to specific events or activities. It also causes excess anxiety in proportion to specific events or activities. The anxiety associated with GAD is also difficult to control. GAD can vary from mild to severe. People with severe GAD can have intense waves of  anxiety with physical symptoms (panic attacks).  SYMPTOMS The anxiety and worry associated with GAD are difficult to control. This anxiety and worry are related to many life events and activities and also occur more days than not for 6 months or longer. People with GAD also have three or more of the following symptoms (one or more in children):  Restlessness.   Fatigue.  Difficulty concentrating.   Irritability.  Muscle tension.  Difficulty sleeping or unsatisfying sleep. DIAGNOSIS GAD is diagnosed through an assessment by your health care provider. Your health care provider will ask you questions aboutyour mood,physical symptoms, and events in your life. Your health care provider may ask you about your medical history and use of alcohol or drugs, including prescription medicines. Your health care provider may also do a physical exam and blood tests. Certain medical conditions and the use of certain substances can cause symptoms similar to those associated with GAD. Your health care provider may refer you to a mental health specialist for further evaluation. TREATMENT The following therapies are usually used to treat GAD:   Medication. Antidepressant medication usually is prescribed for long-term daily control. Antianxiety medicines may be added in severe cases, especially when panic attacks occur.   Talk therapy (psychotherapy). Certain types of talk therapy can be helpful in treating GAD by providing support, education, and guidance. A form of talk therapy called cognitive behavioral therapy can teach you healthy ways to think about and react to daily life events and activities.  Stress managementtechniques. These include yoga, meditation, and exercise and can be very helpful when they are practiced regularly. A mental health specialist can help determine which treatment is best for you. Some people see improvement with one therapy. However, other people require a combination of  therapies.   This information is not intended to replace advice given to you by your health care provider. Make sure you discuss any questions you have with your health care provider.   Document Released: 11/01/2012 Document Revised: 07/28/2014 Document Reviewed: 11/01/2012 Elsevier Interactive Patient Education [employer]

## 2016-02-03 NOTE — ED Notes (Signed)
Pt arrives via [facility]. Pt states she was driving in to the hospital rt SOB when she began having centralized CP with no radiation and states she had increased sob and numbness to the hands and feet bilaterally. Pt received 324mg  of ASA PTA. EMS states pt seemed to be hyperventilating upon their arrival and was initially hypertensive, tachycardic, and tachypnic. During transport pt VS normalized and her pain decreased to 4/10.

## 2016-02-03 NOTE — ED Provider Notes (Signed)
CSN: [id number]     Arrival date & time 02/03/16  1331 History   First MD Initiated Contact with Patient 02/03/16 1355     Chief Complaint  Patient presents with  . Shortness of Breath  . Chest Pain     (Consider location/radiation/quality/duration/timing/severity/associated sxs/prior Treatment) HPI Comments: Pt comes in with cc of pelvic pain and chest tightness. She reports that she started having chest tightness all of a sudden. Discomfort is midsternal and not radiating. PT has no associated n/cough/diopgoresis. Pt did have some dib. No wheezing. Pt has no hx of similar pain. She has no cardiac dz hx and denies any lung dz. Pt has no hx of PE, DVT and denies any exogenous estrogen use, long distance travels or surgery in the past 6 weeks, active cancer, recent immobilization. No premature CAD.   Pt also reports some lower abd pain. Pain is constant, crampy. She alsp has epigastric pain and feels like food is not moving. Normal BM and no nausea currently. Hx of csections. No vaginal discharge. Pt has no uti like symptoms.  Patient is a 55 y.o. female presenting with shortness of breath and chest pain. The history is provided by the patient.  Shortness of Breath Associated symptoms: chest pain   Chest Pain Associated symptoms: shortness of breath     Past Medical History  Diagnosis Date  . Allergy    Past Surgical History  Procedure Laterality Date  . Cesarean section    . Nasal sinus surgery     Family History  Problem Relation Age of Onset  . Cancer Mother     oat cell carcinoma-metastatic  . Heart disease Sister   . Drug abuse Sister   . COPD Father     emphysema   Social History  Substance Use Topics  . Smoking status: Former Smoker -- 2 years  . Smokeless tobacco: Never Used  . Alcohol Use: 0.6 oz/week    1 Glasses of wine per week   OB History    No data available     Review of Systems  Respiratory: Positive for shortness of breath.   Cardiovascular:  Positive for chest pain.      Allergies  Codeine  Home Medications   Prior to Admission medications   Medication Sig Start Date End Date Taking? Authorizing Provider  amoxicillin (AMOXIL) 875 MG tablet Take 1 tablet (875 mg total) by mouth 2 (two) times daily. 07/09/15   Tonye Pearson, MD  fluticasone (FLONASE) 50 MCG/ACT nasal spray Place 2 sprays into both nostrils daily. Reported on 07/09/2015    Historical Provider, MD  loratadine (CLARITIN) 10 MG tablet Take 10 mg by mouth daily.    Historical Provider, MD  predniSONE (DELTASONE) 20 MG tablet 3 PO FOR 3 DAYS, 2 PO FOR 3 DAYS, 1 PO FOR 3 DAYS Patient not taking: Reported on 07/09/2015 10/31/14   John C Pick-Jacobs, DO  pseudoephedrine (SUDAFED) 30 MG tablet Take 30 mg by mouth daily.    Historical Provider, MD   BP 118/78 mmHg  Pulse 84  Temp(Src) 98.2 F (36.8 C) (Oral)  Resp 13  SpO2 100% Physical Exam  Constitutional: She is oriented to person, place, and time. She appears well-developed.  HENT:  Head: Normocephalic and atraumatic.  Eyes: Conjunctivae and EOM are normal. Pupils are equal, round, and reactive to light.  Neck: Normal range of motion. Neck supple. No JVD present.  Cardiovascular: Normal rate, regular rhythm, normal heart sounds and intact distal pulses.  No murmur heard. Pulmonary/Chest: Effort normal and breath sounds normal. No respiratory distress. She has no wheezes.  Abdominal: Soft. Bowel sounds are normal. She exhibits distension. There is tenderness. There is no rebound and no guarding.  Lower quadrant tenderness  Genitourinary: Vagina normal and uterus normal.  External exam - normal, no lesions Speculum exam: Pt has some white discharge, no blood Bimanual exam: Patient has no CMT, no adnexal tenderness or fullness and cervical os is closed  Neurological: She is alert and oriented to person, place, and time.  Skin: Skin is warm and dry.    ED Course  Procedures (including critical care  time) Labs Review Labs Reviewed  WET PREP, GENITAL - Abnormal; Notable for the following:    WBC, Wet Prep HPF POC FEW (*)    All other components within normal limits  COMPREHENSIVE METABOLIC PANEL - Abnormal; Notable for the following:    Potassium 3.4 (*)    Glucose, Bld 106 (*)    All other components within normal limits  CBC WITH DIFFERENTIAL/PLATELET  URINALYSIS, ROUTINE W REFLEX MICROSCOPIC (NOT AT [facility])  I-STAT TROPOININ, ED  I-STAT TROPOININ, ED  GC/CHLAMYDIA PROBE AMP ([facility]) NOT AT [facility]    Imaging Review Dg Chest 2 View  02/03/2016  CLINICAL DATA:  Chest tightness.  Shortness of breath. EXAM: CHEST  2 VIEW COMPARISON:  07/29/2012 FINDINGS: The heart size and mediastinal contours are within normal limits. Increase lung volumes with coarsened interstitial markings. No airspace opacities identified. The visualized skeletal structures are unremarkable. IMPRESSION: No active cardiopulmonary disease. Electronically Signed   By: Signa Kell M.D.   On: 02/03/2016 14:29   US Transvaginal Non-ob  02/03/2016  CLINICAL DATA:  55 year old female with acute pelvic pain for 3 days. EXAM: TRANSABDOMINAL AND TRANSVAGINAL ULTRASOUND OF PELVIS TECHNIQUE: Both transabdominal and transvaginal ultrasound examinations of the pelvis were performed. Transabdominal technique was performed for global imaging of the pelvis including uterus, ovaries, adnexal regions, and pelvic cul-de-sac. It was necessary to proceed with endovaginal exam following the transabdominal exam to visualize the ovaries and endometrium. COMPARISON:  None FINDINGS: Uterus Measurements: 6.9 x 3.4 x 4.1 cm. No fibroids or other mass visualized. Endometrium Thickness: 7 mm.  No focal abnormality visualized. Right ovary Measurements: Not visualized. No adnexal mass. Left ovary Measurements: Not visualized. No adnexal mass. Other findings No abnormal free fluid. IMPRESSION: Ovaries not visualized bilaterally. No evidence of adnexal  mass or free fluid. Unremarkable uterus. Electronically Signed   By: Harmon Pier M.D.   On: 02/03/2016 17:27   US Pelvis Complete  02/03/2016  CLINICAL DATA:  55 year old female with acute pelvic pain for 3 days. EXAM: TRANSABDOMINAL AND TRANSVAGINAL ULTRASOUND OF PELVIS TECHNIQUE: Both transabdominal and transvaginal ultrasound examinations of the pelvis were performed. Transabdominal technique was performed for global imaging of the pelvis including uterus, ovaries, adnexal regions, and pelvic cul-de-sac. It was necessary to proceed with endovaginal exam following the transabdominal exam to visualize the ovaries and endometrium. COMPARISON:  None FINDINGS: Uterus Measurements: 6.9 x 3.4 x 4.1 cm. No fibroids or other mass visualized. Endometrium Thickness: 7 mm.  No focal abnormality visualized. Right ovary Measurements: Not visualized. No adnexal mass. Left ovary Measurements: Not visualized. No adnexal mass. Other findings No abnormal free fluid. IMPRESSION: Ovaries not visualized bilaterally. No evidence of adnexal mass or free fluid. Unremarkable uterus. Electronically Signed   By: Harmon Pier M.D.   On: 02/03/2016 17:27   I have personally reviewed and evaluated these images and  lab results as part of my medical decision-making.   EKG Interpretation   Date/Time:  Sunday February 03 2016 13:39:02 EDT Ventricular Rate:  85 PR Interval:    QRS Duration: 65 QT Interval:  373 QTC Calculation: 444 R Axis:   76 Text Interpretation:  Sinus rhythm Anteroseptal infarct, old Minimal ST  elevation, inferior leads No acute changes No significant change since  last tracing Confirmed by Rhunette CroftNANAVATI, MD, Janey GentaANKIT [phone]([id number]) on 02/03/2016 4:45:00  PM      MDM   Final diagnoses:  Chest discomfort  Lower abdominal pain  Anxiety    Pt comes in with chest tightness. At some point she had bilateral numbness in her hands. - Anxiety - PE - ACS - Asthma exacerbation.  CXR and trops x 2 ordered. If neg, no  clinical concerns for anything severe.  Abd pain - lower quadrant and also epigastric. No peritoneal findings. Pelvic exam unremarkable, but she has tenderness in the lower quadrant. US ordered - if neg, no concerns for severe pelvic etiology. Ua is clean. No concerns for intra-abd infection/appendicitis.   Derwood KaplanAnkit Monico Sudduth, MD 02/03/16 1807

## 2016-02-03 NOTE — ED Notes (Signed)
Patient transported to Ultrasound 

## 2016-02-04 LAB — GC/CHLAMYDIA PROBE AMP (~~LOC~~) NOT AT ARMC
CHLAMYDIA, DNA PROBE: NEGATIVE
Neisseria Gonorrhea: NEGATIVE

## 2016-02-20 ENCOUNTER — Ambulatory Visit (INDEPENDENT_AMBULATORY_CARE_PROVIDER_SITE_OTHER): Payer: BC Managed Care – PPO | Admitting: Family Medicine

## 2016-02-20 ENCOUNTER — Encounter: Payer: Self-pay | Admitting: Family Medicine

## 2016-02-20 VITALS — BP 121/80 | HR 71 | Ht 58.5 in | Wt 101.1 lb

## 2016-02-20 DIAGNOSIS — Z0000 Encounter for general adult medical examination without abnormal findings: Secondary | ICD-10-CM

## 2016-02-20 DIAGNOSIS — Z1211 Encounter for screening for malignant neoplasm of colon: Secondary | ICD-10-CM | POA: Diagnosis not present

## 2016-02-20 DIAGNOSIS — Z8639 Personal history of other endocrine, nutritional and metabolic disease: Secondary | ICD-10-CM | POA: Diagnosis not present

## 2016-02-20 DIAGNOSIS — Z91048 Other nonmedicinal substance allergy status: Secondary | ICD-10-CM

## 2016-02-20 DIAGNOSIS — E785 Hyperlipidemia, unspecified: Secondary | ICD-10-CM | POA: Insufficient documentation

## 2016-02-20 DIAGNOSIS — J329 Chronic sinusitis, unspecified: Secondary | ICD-10-CM | POA: Insufficient documentation

## 2016-02-20 DIAGNOSIS — Z9109 Other allergy status, other than to drugs and biological substances: Secondary | ICD-10-CM

## 2016-02-20 DIAGNOSIS — Z8739 Personal history of other diseases of the musculoskeletal system and connective tissue: Secondary | ICD-10-CM | POA: Insufficient documentation

## 2016-02-20 NOTE — Patient Instructions (Signed)
AHA guidelines for heart healthy activity:   30 minutes of moderate intensity exercise 5 days per week or more  Guidelines for a Low Cholesterol, Low Saturated Fat Diet   Fats - Limit total intake of fats and oils. - Avoid butter, stick margarine, shortening, lard, palm and coconut oils. - Limit mayonnaise, salad dressings, gravies and sauces, unless they are homemade with low-fat ingredients. - Limit chocolate. - Choose low-fat and nonfat products, such as low-fat mayonnaise, low-fat or non-hydrogenated peanut butter, low-fat or fat-free salad dressings and nonfat gravy. - Use vegetable oil, such as canola or olive oil. - Look for margarine that does not contain trans fatty acids. - Use nuts in moderate amounts. - Read ingredient labels carefully to determine both amount and type of fat present in foods. Limit saturated and trans fats! - Avoid high-fat processed and convenience foods.  Meats and Meat Alternatives - Choose fish, chicken, [place] and lean meats. - Use dried beans, peas, lentils and tofu. - Limit egg yolks to three to four per week. - If you eat red meat, limit to no more than three servings per week and choose loin or round cuts. - Avoid fatty meats, such as bacon, sausage, franks, luncheon meats and ribs. - Avoid all organ meats, including liver.  Dairy - Choose nonfat or low-fat milk, yogurt and cottage cheese. - Most cheeses are high in fat. Choose cheeses made from non-fat milk, such as mozzarella and ricotta cheese. - Choose light or fat-free cream cheese and sour cream. - Avoid cream and sauces made with cream.  Fruits and Vegetables - Eat a wide variety of fruits and vegetables. - Use lemon juice, vinegar or "mist" olive oil on vegetables. - Avoid adding sauces, fat or oil to vegetables.  Breads, Cereals and Grains - Choose whole-grain breads, cereals, pastas and rice. - Avoid high-fat snack foods, such as granola, cookies, pies, pastries, doughnuts and  croissants.  Cooking Tips - Avoid deep fried foods. - Trim visible fat off meats and remove skin from poultry before cooking. - Bake, broil, boil, poach or roast poultry, fish and lean meats. - Drain and discard fat that drains out of meat as you cook it. - Add little or no fat to foods. - Use vegetable oil sprays to grease pans for cooking or baking. - Steam vegetables. - Use herbs or no-oil marinades to flavor foods.

## 2016-02-20 NOTE — Assessment & Plan Note (Signed)
Has not had it checked in many yrs now- was tol din the past bad chol too high- pt managed it thru lifestyle changes. Lost to f/up

## 2016-02-20 NOTE — Progress Notes (Signed)
New patient office visit note:  Impression and Recommendations:    1. HLD (hyperlipidemia)   2. Personal history of gout   3. Environmental allergies   4. Routine general medical examination at a health care facility   5. Screening for colon cancer      We'll check fasting cholesterol in the near future  Symptoms stable no acute complaints  Continue Flonase and Claritin  Patient will plan for complete physical in the near future.  Referral sent to GI for screening colonoscopy. Patient has never had one. Risks and benefits associated with this procedure discussed with patient briefly.  Importance of Screening discussed  Patient's Medications  New Prescriptions   No medications on file  Previous Medications   ASPIRIN 81 MG CHEWABLE TABLET    Chew 1 tablet (81 mg total) by mouth daily.   FLUTICASONE (FLONASE) 50 MCG/ACT NASAL SPRAY    Place 2 sprays into both nostrils daily. Reported on 07/09/2015   LORATADINE (CLARITIN) 10 MG TABLET    Take 10 mg by mouth daily.  Modified Medications   No medications on file  Discontinued Medications   AMOXICILLIN (AMOXIL) 875 MG TABLET    Take 1 tablet (875 mg total) by mouth 2 (two) times daily.   FAMOTIDINE (PEPCID) 20 MG TABLET    Take 1 tablet (20 mg total) by mouth 2 (two) times daily.   PREDNISONE (DELTASONE) 20 MG TABLET    3 PO FOR 3 DAYS, 2 PO FOR 3 DAYS, 1 PO FOR 3 DAYS   PSEUDOEPHEDRINE (SUDAFED) 30 MG TABLET    Take 30 mg by mouth daily.   TRAMADOL (ULTRAM) 50 MG TABLET    Take 1 tablet (50 mg total) by mouth every 6 (six) hours as needed.    Return in about 3 weeks (around 03/12/2016) for Fasting blood work near future and then F/up OV to discuss.  The patient was counseled, risk factors were discussed, anticipatory guidance given.  Gross side effects, risk and benefits, and alternatives of medications discussed with patient.  Patient is aware that all medications have potential side effects and we are unable to  predict every side effect or drug-drug interaction that may occur.  Expresses verbal understanding and consents to current therapy plan and treatment regimen.  Please see AVS handed out to patient at the end of our visit for further patient instructions/ counseling done pertaining to today's office visit.    Note: This document was prepared using Dragon voice recognition software and may include unintentional dictation errors.  ----------------------------------------------------------------------------------------------------------------------    Subjective:    Chief Complaint  Patient presents with  . Establish Care    HPI: Jessica Bailey is a pleasant 55 y.o. female who presents to [facility] today to review their medical history with me and establish care.   Dr Asa Saunas- prior acting PCP.  Since her  OB-GYN- Carrington Clamp- acting PCP.  No other pcp for over 5 yrs now.    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   Single (divorced).   Has son and daughter here- 66 and 71 yo.    Walks/ runs about 2 miles 5 d/wk. Takes about 30-69min.   Was told in the past she has high cholesterol.   She declined meds back then and did work on diet and exercise and try to control it on her own.  Has seasonal allergies and all yr long environmental allergies.  Takes Claritin and Flonase daily. Works well.     Patient Active Problem List   Diagnosis Date Noted  . HLD (hyperlipidemia) 02/20/2016  . h/o Sinusitis, chronic 02/20/2016  . Personal history of gout 02/20/2016  . Routine general medical examination at a health care facility 02/20/2016  . Screening for colon cancer 02/20/2016  . Environmental allergies 10/12/2013     Past Medical History:  Diagnosis Date  . Allergy   . Anxiety      Past Surgical History:  Procedure Laterality Date  . CESAREAN SECTION    . NASAL SINUS SURGERY        Family History  Problem Relation Age of Onset  . Cancer Mother     oat cell carcinoma-metastatic  . Heart disease Sister   . Drug abuse Sister   . COPD Father     emphysema     History  Drug Use No    History  Alcohol Use  . 0.6 oz/week  . 1 Glasses of wine per week    History  Smoking Status  . Former Smoker  . Years: 2.00  Smokeless Tobacco  . Never Used    Patient's Medications  New Prescriptions   No medications on file  Previous Medications   ASPIRIN 81 MG CHEWABLE TABLET    Chew 1 tablet (81 mg total) by mouth daily.   FLUTICASONE (FLONASE) 50 MCG/ACT NASAL SPRAY    Place 2 sprays into both nostrils daily. Reported on 07/09/2015   LORATADINE (CLARITIN) 10 MG TABLET    Take 10 mg by mouth daily.  Modified Medications   No medications on file  Discontinued Medications   AMOXICILLIN (AMOXIL) 875 MG TABLET    Take 1 tablet (875 mg total) by mouth 2 (two) times daily.   FAMOTIDINE (PEPCID) 20 MG TABLET    Take 1 tablet (20 mg total) by mouth 2 (two) times daily.   PREDNISONE (DELTASONE) 20 MG TABLET    3 PO FOR 3 DAYS, 2 PO FOR 3 DAYS, 1 PO FOR 3 DAYS   PSEUDOEPHEDRINE (SUDAFED) 30 MG TABLET    Take 30 mg by mouth daily.   TRAMADOL (ULTRAM) 50 MG TABLET    Take 1 tablet (50 mg total) by mouth every 6 (six) hours as needed.    Allergies: Codeine  Review of Systems:   ( Completed via Adult Medical History Intake form today ) General:  Denies fever, chills, appetite changes, unexplained weight loss.  Optho/Auditory:   Denies visual changes, blurred vision/LOV, ringing in ears/ diff hearing Respiratory:   Denies SOB, DOE, cough, wheezing.  Cardiovascular:   Denies chest pain, palpitations, new onset peripheral edema  Gastrointestinal:   Denies nausea, vomiting, diarrhea.  Genitourinary:    Denies dysuria, increased frequency, flank pain.  Endocrine:     Denies hot or cold intolerance, polyuria, polydipsia. Musculoskeletal:  Denies unexplained myalgias,  joint swelling, arthralgias, gait problems.  Skin:  Denies rash, suspicious lesions or new/ changes in moles Neurological:    Denies dizziness, syncope, unexplained weakness, lightheadedness, numbness  Psychiatric/Behavioral:   Denies mood changes, suicidal or homicidal ideations, hallucinations    Objective:   Blood pressure 121/80, pulse 71, height 4' 10.5" (1.486 m), weight 101 lb 1.6 oz (45.9 kg). Body mass index is 20.77 kg/m.   General: Well Developed, well nourished, and in no acute distress.  Neuro: Alert and oriented x3, extra-ocular muscles intact, sensation grossly intact.  HEENT: Normocephalic, atraumatic, pupils equal round reactive to light, neck supple, no gross masses, no carotid bruits, no JVD apprec Skin: no gross suspicious lesions or rashes  Cardiac: Regular rate and rhythm, no murmurs rubs or gallops.  Respiratory: Essentially clear to auscultation bilaterally. Not using accessory muscles, speaking in full sentences.  Abdominal: Soft, not grossly distended Musculoskeletal: Ambulates w/o diff, FROM * 4 ext.  Vasc: less 2 sec cap RF, warm and pink  Psych:  No HI/SI, judgement and insight good.

## 2016-02-28 ENCOUNTER — Other Ambulatory Visit: Payer: BC Managed Care – PPO

## 2016-02-28 DIAGNOSIS — Z130 Encounter for screening for diseases of the blood and blood-forming organs and certain disorders involving the immune mechanism: Secondary | ICD-10-CM

## 2016-02-28 DIAGNOSIS — Z1329 Encounter for screening for other suspected endocrine disorder: Secondary | ICD-10-CM

## 2016-02-28 DIAGNOSIS — E785 Hyperlipidemia, unspecified: Secondary | ICD-10-CM

## 2016-02-28 DIAGNOSIS — D62 Acute posthemorrhagic anemia: Secondary | ICD-10-CM

## 2016-02-28 DIAGNOSIS — Z1321 Encounter for screening for nutritional disorder: Secondary | ICD-10-CM

## 2016-02-28 DIAGNOSIS — Z131 Encounter for screening for diabetes mellitus: Secondary | ICD-10-CM

## 2016-02-29 LAB — CBC WITH DIFFERENTIAL/PLATELET
BASOS ABS: 110 {cells}/uL (ref 0–200)
Basophils Relative: 2 %
EOS ABS: 220 {cells}/uL (ref 15–500)
EOS PCT: 4 %
HCT: 42.7 % (ref 35.0–45.0)
Hemoglobin: 14.2 g/dL (ref 11.7–15.5)
LYMPHS PCT: 49 %
Lymphs Abs: 2695 cells/uL (ref 850–3900)
MCH: 28.7 pg (ref 27.0–33.0)
MCHC: 33.3 g/dL (ref 32.0–36.0)
MCV: 86.3 fL (ref 80.0–100.0)
MONOS PCT: 6 %
MPV: 9.9 fL (ref 7.5–12.5)
Monocytes Absolute: 330 cells/uL (ref 200–950)
NEUTROS ABS: 2145 {cells}/uL (ref 1500–7800)
Neutrophils Relative %: 39 %
PLATELETS: 281 10*3/uL (ref 140–400)
RBC: 4.95 MIL/uL (ref 3.80–5.10)
RDW: 13 % (ref 11.0–15.0)
WBC: 5.5 10*3/uL (ref 3.8–10.8)

## 2016-02-29 LAB — COMPLETE METABOLIC PANEL WITH GFR
ALK PHOS: 73 U/L (ref 33–130)
ALT: 18 U/L (ref 6–29)
AST: 24 U/L (ref 10–35)
Albumin: 4.5 g/dL (ref 3.6–5.1)
BUN: 15 mg/dL (ref 7–25)
CHLORIDE: 102 mmol/L (ref 98–110)
CO2: 24 mmol/L (ref 20–31)
CREATININE: 0.74 mg/dL (ref 0.50–1.05)
Calcium: 9.2 mg/dL (ref 8.6–10.4)
GLUCOSE: 83 mg/dL (ref 65–99)
POTASSIUM: 4.3 mmol/L (ref 3.5–5.3)
SODIUM: 137 mmol/L (ref 135–146)
Total Bilirubin: 0.9 mg/dL (ref 0.2–1.2)
Total Protein: 7.2 g/dL (ref 6.1–8.1)

## 2016-02-29 LAB — LIPID PANEL
CHOL/HDL RATIO: 2.5 ratio (ref <=5.0)
Cholesterol: 230 mg/dL — ABNORMAL HIGH (ref 125–200)
HDL: 91 mg/dL (ref >=46)
LDL Cholesterol: 115 mg/dL (ref <130)
Triglycerides: 122 mg/dL (ref <150)
VLDL: 24 mg/dL (ref <30)

## 2016-02-29 LAB — VITAMIN D 25 HYDROXY (VIT D DEFICIENCY, FRACTURES): VIT D 25 HYDROXY: 62 ng/mL (ref 30–100)

## 2016-02-29 LAB — TSH: TSH: 3.06 mIU/L

## 2016-02-29 LAB — HEMOGLOBIN A1C
HEMOGLOBIN A1C: 5 % (ref <5.7)
MEAN PLASMA GLUCOSE: 97 mg/dL

## 2016-03-11 ENCOUNTER — Ambulatory Visit (INDEPENDENT_AMBULATORY_CARE_PROVIDER_SITE_OTHER): Payer: BC Managed Care – PPO | Admitting: Family Medicine

## 2016-03-11 ENCOUNTER — Encounter: Payer: Self-pay | Admitting: Family Medicine

## 2016-03-11 VITALS — BP 125/81 | HR 78 | Temp 98.6°F | Wt 101.1 lb

## 2016-03-11 DIAGNOSIS — J0120 Acute ethmoidal sinusitis, unspecified: Secondary | ICD-10-CM

## 2016-03-11 DIAGNOSIS — H6983 Other specified disorders of Eustachian tube, bilateral: Secondary | ICD-10-CM

## 2016-03-11 MED ORDER — PREDNISONE 50 MG PO TABS: 50.0000 mg | ORAL_TABLET | Freq: Every day | ORAL | 0 refills | Status: DC

## 2016-03-11 MED ORDER — AMOXICILLIN-POT CLAVULANATE 875-125 MG PO TABS: 1.0000 | ORAL_TABLET | Freq: Two times a day (BID) | ORAL | 0 refills | Status: AC

## 2016-03-11 NOTE — Progress Notes (Signed)
Assessment and plan:  1. Acute ethmoidal sinusitis, recurrence not specified   2. ETD (eustachian tube dysfunction), bilateral      Discussed with patient antibiotics, steroids and Aleve 1-2 over-the-counter every 12 hours for headache\fevers. She will call the office if she is worsening over the next couple of days. - Viral vs Allergic vs Bacterial causes for pt's symptoms reveiwed.    - Supportive care and various OTC medications discussed in addition to any prescribed. - Call or RTC if new symptoms, or if no improvement or worse over next couple days.   - Will consider ABX at that time if sx continue past 5-7 days and worsening.       Patient's Medications  New Prescriptions   AMOXICILLIN-CLAVULANATE (AUGMENTIN) 875-125 MG TABLET    Take 1 tablet by mouth 2 (two) times daily.   PREDNISONE (DELTASONE) 50 MG TABLET    Take 1 tablet (50 mg total) by mouth daily.  Previous Medications   ASPIRIN 81 MG CHEWABLE TABLET    Chew 1 tablet (81 mg total) by mouth daily.   FLUTICASONE (FLONASE) 50 MCG/ACT NASAL SPRAY    Place 2 sprays into both nostrils daily. Reported on 07/09/2015   LORATADINE (CLARITIN) 10 MG TABLET    Take 10 mg by mouth daily.  Modified Medications   No medications on file  Discontinued Medications   No medications on file    Anticipatory guidance and routine counseling done re: condition, txmnt options and need for follow up. All questions of patient's were answered.   Gross side effects, risk and benefits, and alternatives of medications discussed with patient.  Patient is aware that all medications have potential side effects and we are unable to predict every sideeffect or drug-drug interaction that may occur.  Expresses verbal understanding and consents to current therapy plan and treatment regiment.  Return if symptoms worsen or fail to improve, for In follow-up for chronic care as discussed prior.  Please see AVS handed out to patient at the end of our visit  for additional patient instructions/ counseling done pertaining to today's office visit.  Note: This document was prepared using Dragon voice recognition software and may include unintentional dictation errors.    Subjective:    CC: sinus sx  HPI:  Pt presents with URI sx for 7 days.   Bloody sinuses 7 d, then yesterday- a little dizzy, congestion, HA, chills.   - rhinorrhea, -  ST, and mild cough.   No face pain,  + B/l ear pressure/ ear pain, No N/V/D, No-SOB/DIB, No Rash.  not taken anything for sx.  Overall getting W   Patient Active Problem List   Diagnosis Date Noted  . HLD (hyperlipidemia) 02/20/2016  . h/o Sinusitis, chronic 02/20/2016  . Personal history of gout 02/20/2016  . Routine general medical examination at a health care facility 02/20/2016  . Screening for colon cancer 02/20/2016  . Environmental allergies 10/12/2013    Past medical history, Surgical history, Family history reviewed and noted below, Social history, Allergies, and Medications have been entered into the medical record, reviewed and changed as needed.   Allergies  Allergen Reactions  . Codeine     Review of Systems:  No fever/ chills, night sweats, no unintended weight loss, No chest pain, or increased shortness of breath. Pertinent positives and negatives noted in HPI above  Allergies  Allergen Reactions  . Codeine     Objective:   BP 125/81   Pulse 78  Temp 98.6 F (37 C) (Oral)   Wt 101 lb 1.6 oz (45.9 kg)   BMI 20.77 kg/m  Blood pressure 125/81, pulse 78, temperature 98.6 F (37 C), temperature source Oral, weight 101 lb 1.6 oz (45.9 kg). Vitals:   03/11/16 1316  BP: 125/81  Pulse: 78  Temp: 98.6 F (37 C)   Body mass index is 20.77 kg/m.  General: Well Developed, well nourished, appropriate for stated age.  Neuro: Alert and oriented x3, extra-ocular muscles intact, sensation grossly intact.  HEENT: Normocephalic, atraumatic, pupils equal round reactive to light, neck  supple, no masses, no painful lymphadenopathy, TM's intact B/L, no acute findings. Nares- patent, clear d/c, OP- clear, mild erythema; Sinuses tender to palpation especially over ethmoid Skin: Warm and dry, no gross rash. Cardiac: RRR, S1 S2,  no murmurs rubs or gallops.  Respiratory: ECTA B/L and A/P, Not using accessory muscles, speaking in full sentences- unlabored. Vascular:  No gross lower ext edema, cap RF less 2 sec. Psych: No SI/HI, Insight and judgement good

## 2016-03-11 NOTE — Patient Instructions (Signed)
Patient has fevers and cannot return to work until she is fever free and on no medicines for fever for 24 hours.   Sinus Headache A sinus headache occurs when the paranasal sinuses become clogged or swollen. Paranasal sinuses are air pockets within the bones of the face. Sinus headaches can range from mild to severe. CAUSES A sinus headache can result from various conditions that affect the sinuses, such as:  Colds.  Sinus infections.  Allergies. SYMPTOMS The main symptom of this condition is a headache that may feel like pain or pressure in the face, forehead, ears, or upper teeth. People who have a sinus headache often have other symptoms, such as:  Congested or runny nose.  Fever.  Inability to smell. Weather changes can make symptoms worse. DIAGNOSIS This condition may be diagnosed based on:  A physical exam and medical history.  Imaging tests, such as a CT scan and MRI, to check for problems with the sinuses.  A specialist may look into the sinuses with a tool that has a camera (endoscopy). TREATMENT Treatment for this condition depends on the cause.  Sinus pain that is caused by a sinus infection may be treated with antibiotic medicine.  Sinus pain that is caused by allergies may be helped by allergy medicines (antihistamines) and medicated nasal sprays.  Sinus pain that is caused by congestion may be helped by flushing the nose and sinuses with saline solution. HOME CARE INSTRUCTIONS  Take medicines only as directed by your health care provider.  If you were prescribed an antibiotic medicine, finish all of it even if you start to feel better.  If you have congestion, use a nasal spray to help reduce pressure.  If directed, apply a warm, moist washcloth to your face to help relieve pain. SEEK MEDICAL CARE IF:  You have headaches more than one time each week.  You have sensitivity to light or sound.  You have a fever.  You feel sick to your stomach  (nauseous) or you throw up (vomit).  Your headaches do not get better with treatment. Many people think that they have a sinus headache when they actually have migraines or tension headaches. SEEK IMMEDIATE MEDICAL CARE IF:  You have vision problems.  You have sudden, severe pain in your face or head.  You have a seizure.  You are confused.  You have a stiff neck.   This information is not intended to replace advice given to you by your health care provider. Make sure you discuss any questions you have with your health care provider.   Document Released: 08/14/2004 Document Revised: 11/21/2014 Document Reviewed: 07/03/2014 Elsevier Interactive Patient Education 2016 Elsevier Inc.     Fever, Adult A fever is an increase in the body's temperature. It is usually defined as a temperature of 100F (38C) or higher. Brief mild or moderate fevers generally have no long-term effects, and they often do not require treatment. Moderate or high fevers may make you feel uncomfortable and can sometimes be a sign of a serious illness or disease. The sweating that may occur with repeated or prolonged fever may also cause dehydration. Fever is confirmed by taking a temperature with a thermometer. A measured temperature can vary with:  Age.  Time of day.  Location of the thermometer:  Mouth (oral).  Rectum (rectal).  Ear (tympanic).  Underarm (axillary).  Forehead (temporal). HOME CARE INSTRUCTIONS Pay attention to any changes in your symptoms. Take these actions to help with your condition:  Take  over-the counter and prescription medicines only as told by your health care provider. Follow the dosing instructions carefully.  If you were prescribed an antibiotic medicine, take it as told by your health care provider. Do not stop taking the antibiotic even if you start to feel better.  Rest as needed.  Drink enough fluid to keep your urine clear or pale yellow. This helps to prevent  dehydration.  Sponge yourself or bathe with room-temperature water to help reduce your body temperature as needed. Do not use ice water.  Do not overbundle yourself in blankets or heavy clothes. SEEK MEDICAL CARE IF:  You vomit.  You cannot eat or drink without vomiting.  You have diarrhea.  You have pain when you urinate.  Your symptoms do not improve with treatment.  You develop new symptoms.  You develop excessive weakness. SEEK IMMEDIATE MEDICAL CARE IF:  You have shortness of breath or have trouble breathing.  You are dizzy or you faint.  You are disoriented or confused.  You develop signs of dehydration, such as a dry mouth, decreased urination, or paleness.  You develop severe pain in your abdomen.  You have persistent vomiting or diarrhea.  You develop a skin rash.  Your symptoms suddenly get worse.   This information is not intended to replace advice given to you by your health care provider. Make sure you discuss any questions you have with your health care provider.   Document Released: 12/31/2000 Document Revised: 03/28/2015 Document Reviewed: 08/31/2014 Elsevier Interactive Patient Education [employer]2016 Elsevier Inc.

## 2016-05-02 LAB — HM MAMMOGRAPHY

## 2016-09-22 ENCOUNTER — Encounter: Payer: Self-pay | Admitting: Adult Health

## 2016-09-22 ENCOUNTER — Ambulatory Visit (INDEPENDENT_AMBULATORY_CARE_PROVIDER_SITE_OTHER): Payer: BC Managed Care – PPO | Admitting: Adult Health

## 2016-09-22 DIAGNOSIS — J0100 Acute maxillary sinusitis, unspecified: Secondary | ICD-10-CM

## 2016-09-22 MED ORDER — FLUTICASONE PROPIONATE 50 MCG/ACT NA SUSP: 2.0000 | Freq: Every day | NASAL | 6 refills | Status: DC

## 2016-09-22 MED ORDER — AMOXICILLIN-POT CLAVULANATE 875-125 MG PO TABS: 1.0000 | ORAL_TABLET | Freq: Two times a day (BID) | ORAL | 0 refills | Status: DC

## 2016-09-22 NOTE — Patient Instructions (Signed)

## 2016-09-22 NOTE — Progress Notes (Signed)
Subjective:    Patient ID: Jessica Bailey, female    DOB: [birth year], 56 y.o.   MRN: [id number]  HPI:  Jessica Bailey presents with sinus pressure, facial pressure, popin ears, clear/thick nasal drainage and non-productive cough that has been occurring > 2 weeks.  She has been using OTC Sudafed and antihistamine with only minimal sx relief.  She denies CP/dyspnea at rest/fever/night sweats, however reports "burning in my chest when I cough really hard".   Patient Care Team    Relationship Specialty Notifications Start End  Thomasene Loteborah Opalski, DO PCP - General Family Medicine  02/19/16     Patient Active Problem List   Diagnosis Date Noted  . Sinusitis, acute, maxillary 09/22/2016  . HLD (hyperlipidemia) 02/20/2016  . h/o Sinusitis, chronic 02/20/2016  . Personal history of gout 02/20/2016  . Routine general medical examination at a health care facility 02/20/2016  . Screening for colon cancer 02/20/2016  . Environmental allergies 10/12/2013     Past Medical History:  Diagnosis Date  . Allergy   . Anxiety      Past Surgical History:  Procedure Laterality Date  . CESAREAN SECTION    . NASAL SINUS SURGERY       Family History  Problem Relation Age of Onset  . Cancer Mother     oat cell carcinoma-metastatic  . Heart disease Sister   . Drug abuse Sister   . COPD Father     emphysema  . Hyperlipidemia Father   . Stroke Paternal Grandmother      History  Drug Use No     History  Alcohol Use  . 1.8 oz/week  . 3 Glasses of wine per week     History  Smoking Status  . Former Smoker  . Years: 2.00  Smokeless Tobacco  . Never Used     Outpatient Encounter Prescriptions as of 09/22/2016  Medication Sig  . aspirin 81 MG chewable tablet Chew 1 tablet (81 mg total) by mouth daily.  Marland Kitchen. loratadine (CLARITIN) 10 MG tablet Take 10 mg by mouth daily.  Marland Kitchen. amoxicillin-clavulanate (AUGMENTIN) 875-125 MG tablet Take 1 tablet by mouth 2 (two) times daily.  . fluticasone  (FLONASE) 50 MCG/ACT nasal spray Place 2 sprays into both nostrils daily.  . [DISCONTINUED] fluticasone (FLONASE) 50 MCG/ACT nasal spray Place 2 sprays into both nostrils daily. Reported on 07/09/2015  . [DISCONTINUED] predniSONE (DELTASONE) 50 MG tablet Take 1 tablet (50 mg total) by mouth daily.   No facility-administered encounter medications on file as of 09/22/2016.     Allergies: Codeine  Body mass index is 22.74 kg/m.  Blood pressure 105/68, pulse 71, temperature 98.6 F (37 C), temperature source Oral, height 4' 10.5" (1.486 m), weight 110 lb 11.2 oz (50.2 kg).     Review of Systems  Constitutional: Positive for activity change and fatigue. Negative for appetite change, chills, diaphoresis, fever and unexpected weight change.  HENT: Positive for congestion, postnasal drip, rhinorrhea, sinus pain, sinus pressure and sneezing. Negative for sore throat, tinnitus, trouble swallowing and voice change.   Eyes: Negative for visual disturbance.  Respiratory: Positive for cough and chest tightness. Negative for shortness of breath, wheezing and stridor.   Cardiovascular: Negative for chest pain, palpitations and leg swelling.  Gastrointestinal: Negative for abdominal distention, abdominal pain, blood in stool, constipation, diarrhea, nausea and vomiting.  Endocrine: Negative for cold intolerance, heat intolerance, polydipsia, polyphagia and polyuria.  Genitourinary: Negative for difficulty urinating and flank pain.  Skin: Negative for color change,  pallor, rash and wound.  Neurological: Negative for dizziness, tremors, weakness and headaches.       Objective:   Physical Exam  Constitutional: She is oriented to person, place, and time. She appears well-developed and well-nourished. No distress.  HENT:  Head: Normocephalic and atraumatic.  Right Ear: Hearing normal. Tympanic membrane is erythematous and bulging.  Left Ear: Hearing normal. Tympanic membrane is erythematous and  bulging.  Nose: Mucosal edema, rhinorrhea and sinus tenderness present. Right sinus exhibits maxillary sinus tenderness and frontal sinus tenderness. Left sinus exhibits maxillary sinus tenderness and frontal sinus tenderness.  Mouth/Throat: Uvula is midline. Posterior oropharyngeal edema, posterior oropharyngeal erythema and tonsillar abscesses present. No oropharyngeal exudate.  Cardiovascular: Normal rate, regular rhythm, normal heart sounds and intact distal pulses.   Pulmonary/Chest: Effort normal and breath sounds normal. No respiratory distress. She has no wheezes. She has no rales. She exhibits no tenderness.  Neurological: She is alert and oriented to person, place, and time.  Skin: Skin is warm and dry. No rash noted. She is not diaphoretic. No erythema. No pallor.  Psychiatric: She has a normal mood and affect. Her behavior is normal. Judgment and thought content normal.  Nursing note and vitals reviewed.         Assessment & Plan:   1. Acute maxillary sinusitis, recurrence not specified     Sinusitis, acute, maxillary Augmentin Flonase Loratadine  Increase water/rest/vit c. If sx's persist after ABX completed, then please RTC.    FOLLOW-UP:  Return if symptoms worsen or fail to improve.

## 2016-09-22 NOTE — Assessment & Plan Note (Signed)
Augmentin Flonase Loratadine  Increase water/rest/vit c. If sx's persist after ABX completed, then please RTC.

## 2017-01-18 IMAGING — US US PELVIS COMPLETE
1 series · 14 of 25 positions shown · non-contrast
Comparison: None

CLINICAL DATA: 55-year-old female with acute pelvic pain for 3
days.

EXAM:
TRANSABDOMINAL AND TRANSVAGINAL ULTRASOUND OF PELVIS
TECHNIQUE: Both transabdominal and transvaginal ultrasound examinations of the
pelvis were performed. Transabdominal technique was performed for
global imaging of the pelvis including uterus, ovaries, adnexal
regions, and pelvic cul-de-sac. It was necessary to proceed with
endovaginal exam following the transabdominal exam to visualize the
ovaries and endometrium.

[Series 1: us pelvis complete · 0.21mm/px · 32 acquisitions, 14 frames shown]
[im 1/32]
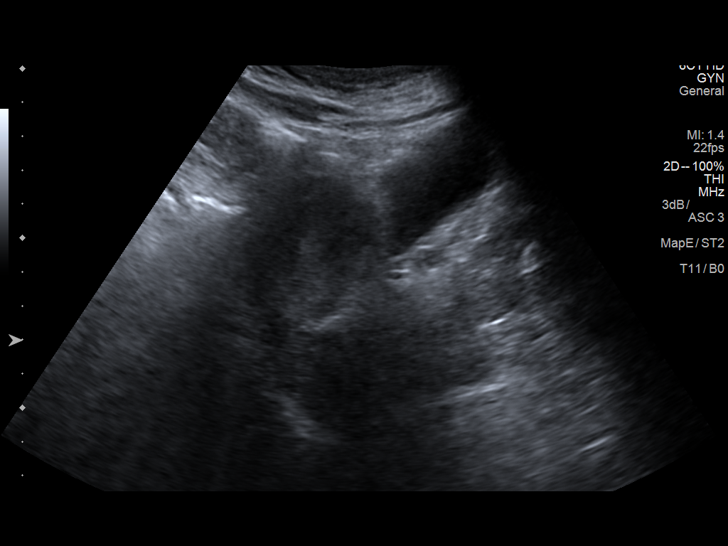
[im 3/32]
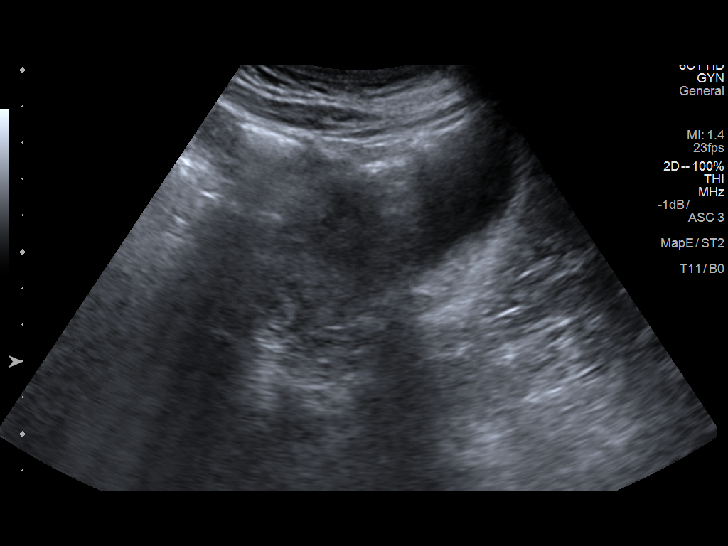
[im 6/32]
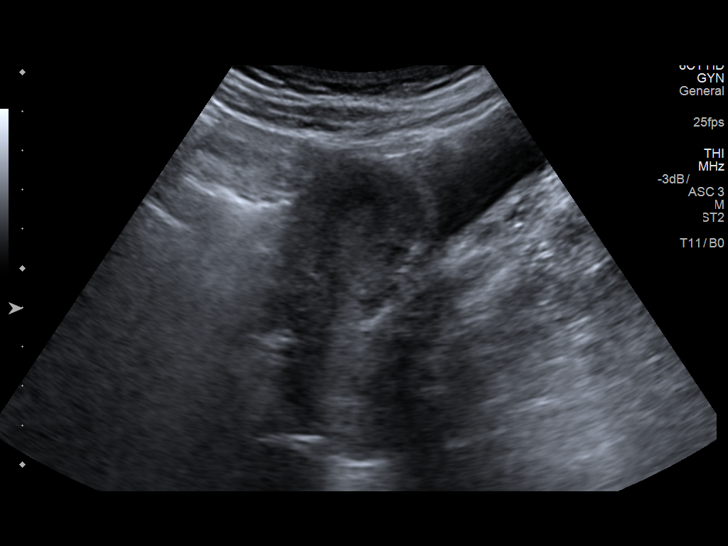
[im 8/32]
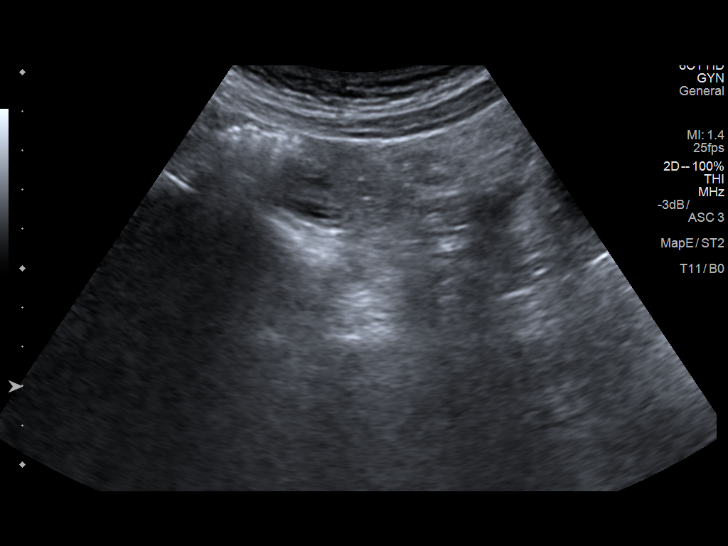
[im 11/32]
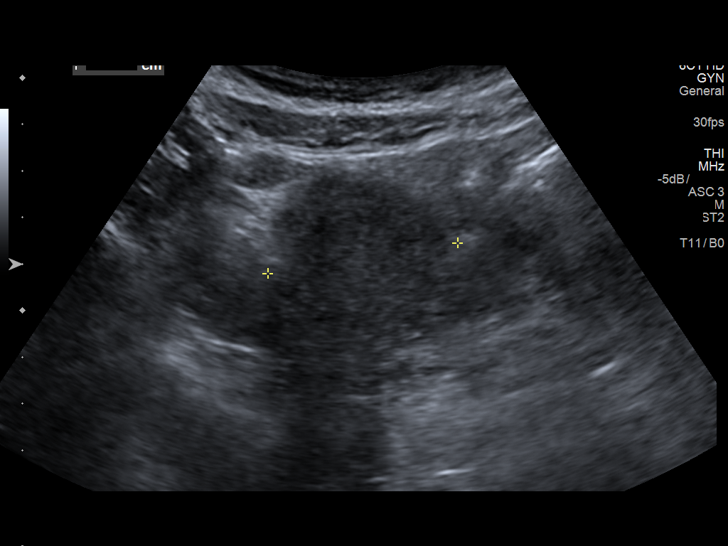
[im 12/32]
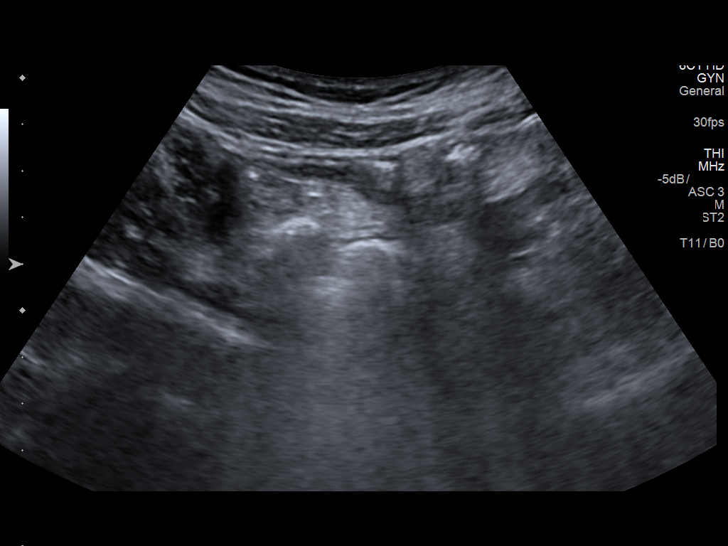
[im 15/32]
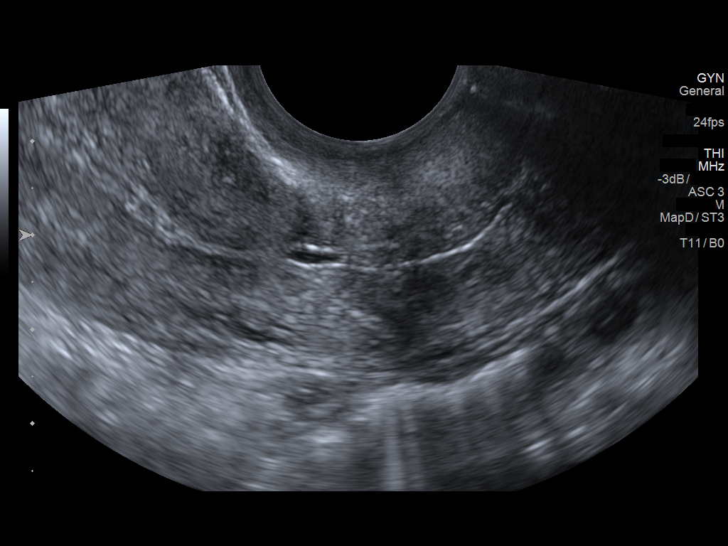
[im 17/32]
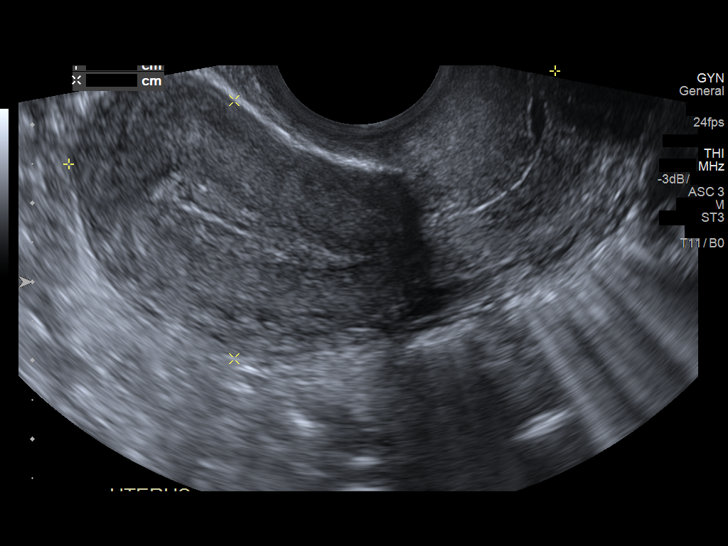
[im 20/32]
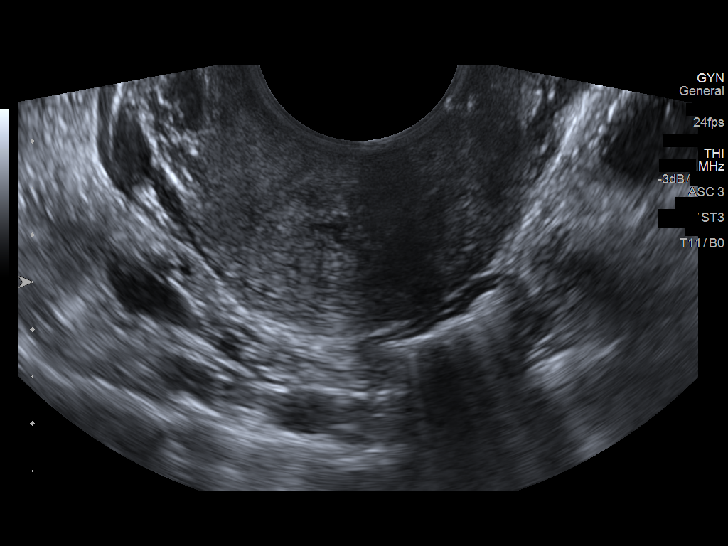
[im 21/32]
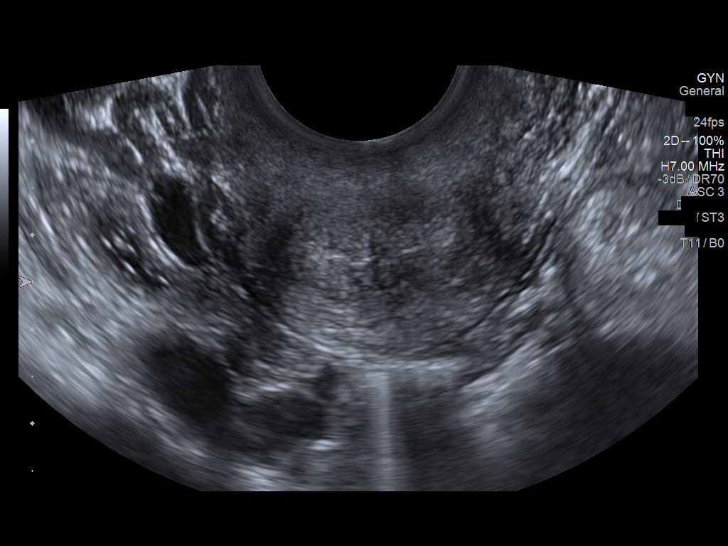
[im 24/32]
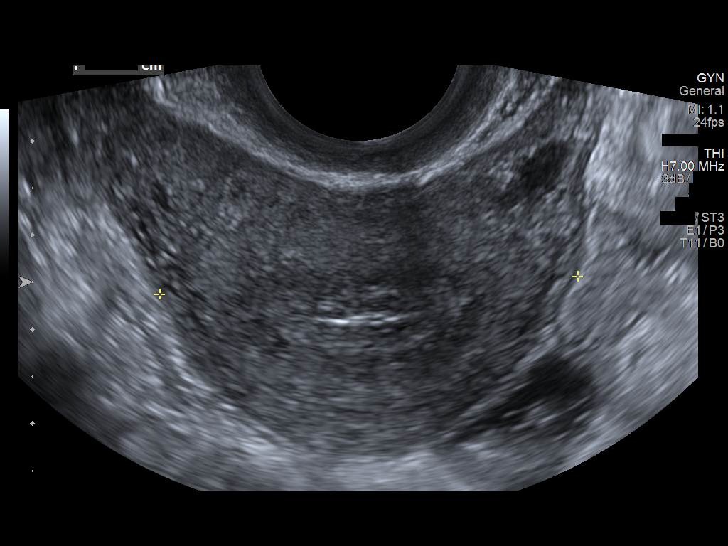
[im 26/32]
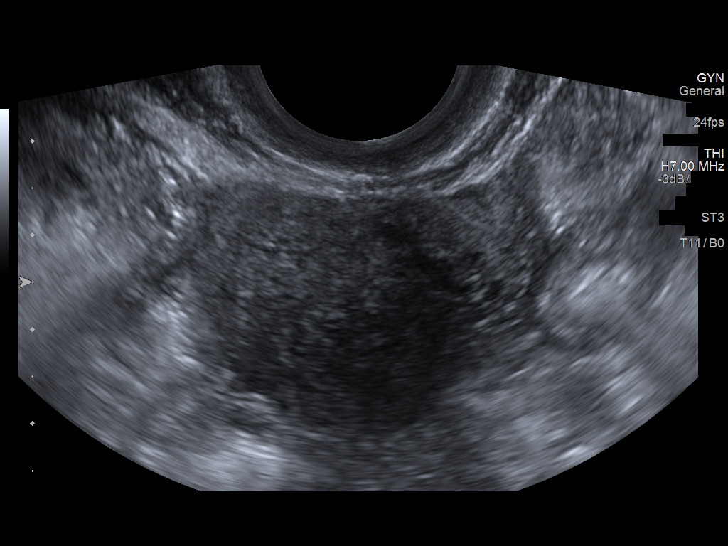
[im 29/32]
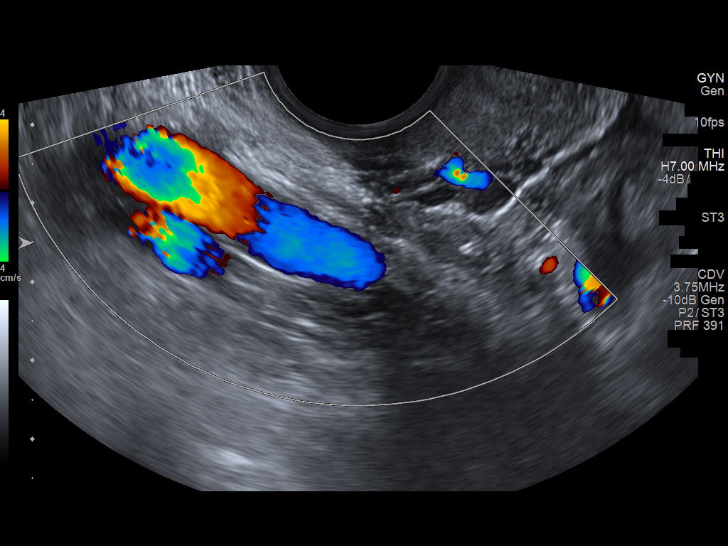
[im 32/32]
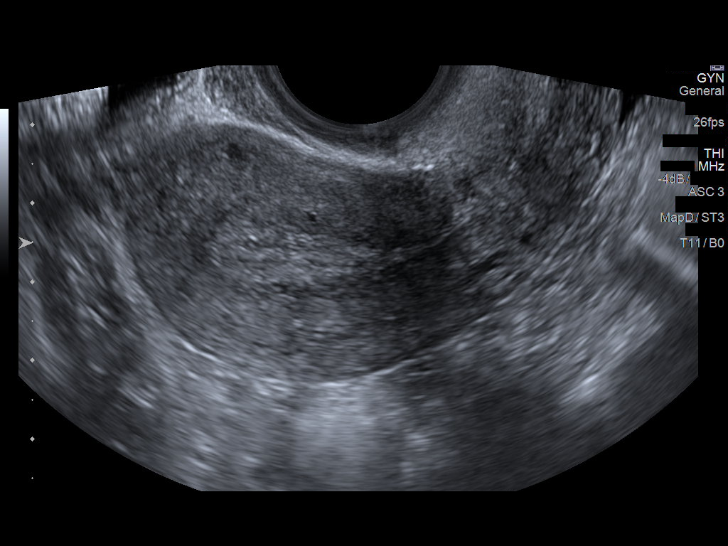

[14 of 25 positions shown; findings below may reference images not displayed]

FINDINGS: Uterus

Measurements: 6.9 x 3.4 x 4.1 cm. No fibroids or other mass
visualized.

Endometrium

Thickness: 7 mm.  No focal abnormality visualized.

Right ovary

Measurements: Not visualized. No adnexal mass.

Left ovary

Measurements: Not visualized. No adnexal mass.

Other findings

No abnormal free fluid.
IMPRESSION: Ovaries not visualized bilaterally. No evidence of adnexal mass or
free fluid.

Unremarkable uterus.

## 2017-02-20 ENCOUNTER — Encounter: Payer: Self-pay | Admitting: Family Medicine

## 2017-02-20 ENCOUNTER — Ambulatory Visit (INDEPENDENT_AMBULATORY_CARE_PROVIDER_SITE_OTHER): Payer: BC Managed Care – PPO | Admitting: Family Medicine

## 2017-02-20 VITALS — BP 106/71 | HR 76 | Ht 58.5 in | Wt 98.4 lb

## 2017-02-20 DIAGNOSIS — J324 Chronic pansinusitis: Secondary | ICD-10-CM | POA: Diagnosis not present

## 2017-02-20 DIAGNOSIS — J309 Allergic rhinitis, unspecified: Secondary | ICD-10-CM | POA: Diagnosis not present

## 2017-02-20 DIAGNOSIS — H6983 Other specified disorders of Eustachian tube, bilateral: Secondary | ICD-10-CM | POA: Diagnosis not present

## 2017-02-20 DIAGNOSIS — J0100 Acute maxillary sinusitis, unspecified: Secondary | ICD-10-CM

## 2017-02-20 DIAGNOSIS — Z9109 Other allergy status, other than to drugs and biological substances: Secondary | ICD-10-CM

## 2017-02-20 MED ORDER — CLINDAMYCIN HCL 300 MG PO CAPS: 300.0000 mg | ORAL_CAPSULE | Freq: Three times a day (TID) | ORAL | 0 refills | Status: DC

## 2017-02-20 MED ORDER — PREDNISONE 10 MG PO TABS: ORAL_TABLET | ORAL | 0 refills | Status: DC

## 2017-02-20 MED ORDER — METHYLPREDNISOLONE ACETATE 40 MG/ML IJ SUSP
40.0000 mg | Freq: Once | INTRAMUSCULAR | Status: AC
  Administered 2017-02-20: 40 mg via INTRAMUSCULAR

## 2017-02-20 NOTE — Progress Notes (Signed)
Pt here for an acute care OV today   Impression and Recommendations:    1. Chronic pansinusitis   2. ETD (Eustachian tube dysfunction), bilateral   3. Acute maxillary sinusitis, recurrence not specified   4. Allergic rhinitis, unspecified seasonality, unspecified trigger   5. Environmental allergies      No problem-specific Assessment & Plan notes found for this encounter.  Patient is complaining of headache, bilat ear pain, post nasal drainage x 2 wks   The patient was counseled, risk factors were discussed, anticipatory guidance given.  New Prescriptions   CLINDAMYCIN (CLEOCIN) 300 MG CAPSULE    Take 1 capsule (300 mg total) by mouth 3 (three) times daily.   PREDNISONE (DELTASONE) 10 MG TABLET    4 tablets daily for 2 days,  3 tablets daily for 2 days,  2 tablets daily for 2 days,  then 1 tablet daily for 2 days    Discontinued Medications   No medications on file    Modified Medications   No medications on file    No orders of the defined types were placed in this encounter.    Gross side effects, risk and benefits, and alternatives of medications and treatment plan in general discussed with patient.  Patient is aware that all medications have potential side effects and we are unable to predict every side effect or drug-drug interaction that may occur.   Patient will call with any questions prior to using medication if they have concerns.  Expresses verbal understanding and consents to current therapy and treatment regimen.  No barriers to understanding were identified.  Red flag symptoms and signs discussed in detail.  Patient expressed understanding regarding what to do in case of emergency\urgent symptoms  Please see AVS handed out to patient at the end of our visit for further patient instructions/ counseling done pertaining to today's office visit.   Return if symptoms worsen or fail to improve, for Follow-up near future for chronic care as well..     Note:  This document was prepared using Dragon voice recognition software and may include unintentional dictation errors.  Jessica Bailey 11:52 AM --------------------------------------------------------------------------------------------------------------------------------------------------------------------------------------------------------------------------------------------    Subjective:    CC:  Chief Complaint  Patient presents with  . Sinus Problem    headache, bilat ear pain, post nasal drainage x 2 wks     HPI: Jessica Bailey is a 56 y.o. female [facility] issues as discussed below.  Sx started gettign worse abotu 1-2 wks ago.  She has bad sinus problems but her Claritin, Sudafed and Flonase and has not been well controlling her symptoms. No F/C, no face pain,    + tightness in face, + HA's, ears are full and ringing in L ear- worse about 1 week now.   Last seen in 09/22/16--> for same sx--> given augmentin and helped some but never felt like it cleared.  FLying in 4 days.       No problems updated.   Wt Readings from Last 3 Encounters:  02/20/17 98 lb 6.4 oz (44.6 kg)  09/22/16 110 lb 11.2 oz (50.2 kg)  03/11/16 101 lb 1.6 oz (45.9 kg)   BP Readings from Last 3 Encounters:  02/20/17 106/71  09/22/16 105/68  03/11/16 125/81   BMI Readings from Last 3 Encounters:  02/20/17 20.22 kg/m  09/22/16 22.74 kg/m  03/11/16 20.77 kg/m     Patient Care Team    Relationship Specialty  Notifications Start End  Thomasene Lotpalski, Makaley Storts, DO PCP - General Family Medicine  02/19/16      Patient Active Problem List   Diagnosis Date Noted  . Sinusitis, acute, maxillary 09/22/2016  . HLD (hyperlipidemia) 02/20/2016  . h/o Sinusitis, chronic 02/20/2016  . Personal history of gout 02/20/2016  . Routine general medical examination at a health care facility 02/20/2016  . Screening for colon cancer 02/20/2016  . Environmental  allergies 10/12/2013    Past Medical history, Surgical history, Family history, Social history, Allergies and Medications have been entered into the medical record, reviewed and changed as needed.    Current Meds  Medication Sig  . fluticasone (FLONASE) 50 MCG/ACT nasal spray Place 2 sprays into both nostrils daily.  Marland Kitchen. loratadine (CLARITIN) 10 MG tablet Take 10 mg by mouth daily.    Allergies:  Allergies  Allergen Reactions  . Codeine      Review of Systems: General:   Denies fever, chills, unexplained weight loss.  Optho/Auditory:   Denies visual changes, blurred vision/LOV Respiratory:   Denies wheeze, DOE more than baseline levels.  Cardiovascular:   Denies chest pain, palpitations, new onset peripheral edema  Gastrointestinal:   Denies nausea, vomiting, diarrhea, abd pain.  Genitourinary: Denies dysuria, freq/ urgency, flank pain or discharge from genitals.  Endocrine:     Denies hot or cold intolerance, polyuria, polydipsia. Musculoskeletal:   Denies unexplained myalgias, joint swelling, unexplained arthralgias, gait problems.  Skin:  Denies new onset rash, suspicious lesions Neurological:     Denies dizziness, unexplained weakness, numbness  Psychiatric/Behavioral:   Denies mood changes, suicidal or homicidal ideations, hallucinations    Objective:   Blood pressure 106/71, pulse 76, height 4' 10.5" (1.486 m), weight 98 lb 6.4 oz (44.6 kg). Body mass index is 20.22 kg/m. General:  Well Developed, well nourished, appropriate for stated age.  Neuro:  Alert and oriented,  extra-ocular muscles intact  HEENT:  Normocephalic, atraumatic, neck supple Skin:  no gross rash, warm, pink. Cardiac:  RRR, S1 S2 Respiratory:  ECTA B/L and A/P, Not using accessory muscles, speaking in full sentences- unlabored. Vascular:  Ext warm, no cyanosis apprec.; cap RF less 2 sec. Psych:  No HI/SI, judgement and insight good, Euthymic mood. Full Affect.

## 2017-02-20 NOTE — Patient Instructions (Addendum)
Steroid injection today  netti pot/ sinus rinses--> twice daily!!!  Followed by one s[pary in ea nostril of flnaose.   Lloyd Hugereil med sinus rinses.    Only use antibiotics if you develop a fever and one-sided face pain.  Otherwise, continue all of your current medicines for chronic control of your sinus sx.      Sinus Rinse What is a sinus rinse? A sinus rinse is a simple home treatment that is used to rinse your sinuses with a sterile mixture of salt and water (saline solution). Sinuses are air-filled spaces in your skull behind the bones of your face and forehead that open into your nasal cavity. You will use the following:  Saline solution.  Neti pot or spray bottle. This releases the saline solution into your nose and through your sinuses. Neti pots and spray bottles can be purchased at [facility] local [facility], a health food store, or online.  When would I do a sinus rinse? A sinus rinse can help to clear mucus, dirt, dust, or pollen from the nasal cavity. You may do a sinus rinse when you have a cold, a virus, nasal allergy symptoms, a sinus infection, or stuffiness in the nose or sinuses. If you are considering a sinus rinse:  Ask your child's health care provider before performing a sinus rinse on your child.  Do not do a sinus rinse if you have had ear or nasal surgery, ear infection, or blocked ears.  How do I do a sinus rinse?  Wash your hands.  Disinfect your device according to the directions provided and then dry it.  Use the solution that comes with your device or one that is sold separately in stores. Follow the mixing directions on the package.  Fill your device with the amount of saline solution as directed by the device instructions.  Stand over a sink and tilt your head sideways over the sink.  Place the spout of the device in your upper nostril (the one closer to the ceiling).  Gently pour or squeeze the saline solution into the nasal cavity. The liquid should drain  to the lower nostril if you are not overly congested.  Gently blow your nose. Blowing too hard may cause ear pain.  Repeat in the other nostril.  Clean and rinse your device with clean water and then air-dry it. Are there risks of a sinus rinse? Sinus rinse is generally very safe and effective. However, there are a few risks, which include:  A burning sensation in the sinuses. This may happen if you do not make the saline solution as directed. Make sure to follow all directions when making the saline solution.  Infection from contaminated water. This is rare, but possible.  Nasal irritation.  This information is not intended to replace advice given to you by your health care provider. Make sure you discuss any questions you have with your health care provider. Document Released: 02/01/2014 Document Revised: 06/03/2016 Document Reviewed: 11/22/2013 Elsevier Interactive Patient Education  2017 [employer]

## 2017-06-05 LAB — CBC AND DIFFERENTIAL
HEMATOCRIT: 43 (ref 36–46)
HEMOGLOBIN: 13.9 (ref 12.0–16.0)
Platelets: 263 (ref 150–399)
WBC: 4.7

## 2017-06-05 LAB — HEPATIC FUNCTION PANEL
ALT: 22 (ref 7–35)
AST: 31 (ref 13–35)
Alkaline Phosphatase: 65 (ref 25–125)
Bilirubin, Total: 0.7

## 2017-06-05 LAB — LIPID PANEL
CHOLESTEROL: 268 — AB (ref 0–200)
Cholesterol: 268 — AB (ref 0–200)
HDL: 91 — AB (ref 35–70)
HDL: 91 — AB (ref 35–70)
LDL CALC: 129
LDL Cholesterol: 129
Triglycerides: 240 — AB (ref 40–160)
Triglycerides: 240 — AB (ref 40–160)

## 2017-06-05 LAB — HEMOGLOBIN A1C
Hemoglobin A1C: 5.3
Hemoglobin A1C: 5.3

## 2017-06-05 LAB — HM MAMMOGRAPHY

## 2017-06-05 LAB — BASIC METABOLIC PANEL
BUN: 11 (ref 4–21)
Creatinine: 0.8 (ref 0.5–1.1)
GLUCOSE: 90
Potassium: 4.1 (ref 3.4–5.3)
SODIUM: 139 (ref 137–147)

## 2017-06-05 LAB — TSH: TSH: 2.42 (ref 0.41–5.90)

## 2017-06-09 ENCOUNTER — Ambulatory Visit: Payer: BC Managed Care – PPO | Admitting: Family Medicine

## 2017-07-07 ENCOUNTER — Encounter: Payer: Self-pay | Admitting: Adult Health

## 2017-07-07 ENCOUNTER — Ambulatory Visit: Payer: BC Managed Care – PPO | Admitting: Adult Health

## 2017-07-07 ENCOUNTER — Encounter: Payer: Self-pay | Admitting: Family Medicine

## 2017-07-07 VITALS — BP 103/69 | HR 74 | Temp 98.4°F | Ht 58.5 in | Wt 103.3 lb

## 2017-07-07 DIAGNOSIS — R05 Cough: Secondary | ICD-10-CM | POA: Diagnosis not present

## 2017-07-07 DIAGNOSIS — J0100 Acute maxillary sinusitis, unspecified: Secondary | ICD-10-CM

## 2017-07-07 DIAGNOSIS — R059 Cough, unspecified: Secondary | ICD-10-CM

## 2017-07-07 MED ORDER — AMOXICILLIN-POT CLAVULANATE 500-125 MG PO TABS: 1.0000 | ORAL_TABLET | Freq: Two times a day (BID) | ORAL | 0 refills | Status: DC

## 2017-07-07 MED ORDER — BENZONATATE 200 MG PO CAPS: 200.0000 mg | ORAL_CAPSULE | Freq: Two times a day (BID) | ORAL | 0 refills | Status: DC | PRN

## 2017-07-07 NOTE — Assessment & Plan Note (Signed)
Augmentin 500/125mg  BID- she developed diarrhea with the 875/125, therefore will use lower dose. Flonase, increase fluids/rest/vit-c, 2000mg /day when feeling ill.

## 2017-07-07 NOTE — Progress Notes (Signed)
Subjective:    Patient ID: Jessica Bailey, female    DOB: Apr 07, 1961, 56 y.o.   MRN: [id number]  HPI:  [name] presents with thick/nasal drainage, sinus pressure/pain, sore throat (2/10), constant HA (6/10- all over skull), non-productive cough, pronounced fatigue, poor appetite, "feeling warm" that all began > 3 weeks ago and has been steadily worsening each day.  She has been pushing water, 5 x 16 oz bottles of water/day and has used OTC Sudafed intermittently.   She denies CP/dyspnea at rest/palpitations/N/V, had one loose stool this morning and denies blood in urine/stool.  Patient Care Team    Relationship Specialty Notifications Start End  Thomasene Lotpalski, Deborah, DO PCP - General Family Medicine  02/19/16     Patient Active Problem List   Diagnosis Date Noted  . Cough 07/07/2017  . Sinusitis, acute, maxillary 09/22/2016  . HLD (hyperlipidemia) 02/20/2016  . h/o Sinusitis, chronic 02/20/2016  . Personal history of gout 02/20/2016  . Routine general medical examination at a health care facility 02/20/2016  . Screening for colon cancer 02/20/2016  . Environmental allergies 10/12/2013     Past Medical History:  Diagnosis Date  . Allergy   . Anxiety      Past Surgical History:  Procedure Laterality Date  . CESAREAN SECTION    . NASAL SINUS SURGERY       Family History  Problem Relation Age of Onset  . Cancer Mother        oat cell carcinoma-metastatic  . Heart disease Sister   . Drug abuse Sister   . COPD Father        emphysema  . Hyperlipidemia Father   . Stroke Paternal Grandmother      Social History   Substance and Sexual Activity  Drug Use No     Social History   Substance and Sexual Activity  Alcohol Use Yes  . Alcohol/week: 1.8 oz  . Types: 3 Glasses of wine per week     Social History   Tobacco Use  Smoking Status Former Smoker  . Years: 2.00  Smokeless Tobacco Never Used     Outpatient Encounter Medications as of 07/07/2017   Medication Sig  . fluticasone (FLONASE) 50 MCG/ACT nasal spray Place 2 sprays into both nostrils daily.  Marland Kitchen. loratadine (CLARITIN) 10 MG tablet Take 10 mg by mouth daily.  Marland Kitchen. amoxicillin-clavulanate (AUGMENTIN) 500-125 MG tablet Take 1 tablet (500 mg total) by mouth 2 (two) times daily with a meal.  . aspirin 81 MG chewable tablet Chew 1 tablet (81 mg total) by mouth daily. (Patient not taking: Reported on 07/07/2017)  . benzonatate (TESSALON) 200 MG capsule Take 1 capsule (200 mg total) by mouth 2 (two) times daily as needed for cough.  . [DISCONTINUED] amoxicillin-clavulanate (AUGMENTIN) 875-125 MG tablet Take 1 tablet by mouth 2 (two) times daily.  . [DISCONTINUED] clindamycin (CLEOCIN) 300 MG capsule Take 1 capsule (300 mg total) by mouth 3 (three) times daily.  . [DISCONTINUED] predniSONE (DELTASONE) 10 MG tablet 4 tablets daily for 2 days,  3 tablets daily for 2 days,  2 tablets daily for 2 days,  then 1 tablet daily for 2 days   No facility-administered encounter medications on file as of 07/07/2017.     Allergies: Codeine  Body mass index is 21.22 kg/m.  Blood pressure 103/69, pulse 74, temperature 98.4 F (36.9 C), temperature source Oral, height 4' 10.5" (1.486 m), weight 103 lb 4.8 oz (46.9 kg), SpO2 100 %.  Review of Systems  Constitutional: Positive for activity change, appetite change, fatigue and fever. Negative for chills, diaphoresis and unexpected weight change.  HENT: Positive for congestion, postnasal drip, rhinorrhea, sinus pressure, sinus pain and sore throat. Negative for trouble swallowing and voice change.   Eyes: Negative for visual disturbance.  Respiratory: Positive for cough. Negative for chest tightness, shortness of breath, wheezing and stridor.   Cardiovascular: Negative for chest pain, palpitations and leg swelling.  Gastrointestinal: Negative for abdominal distention, abdominal pain, blood in stool, constipation, diarrhea, nausea and vomiting.   Endocrine: Negative for cold intolerance, heat intolerance, polydipsia, polyphagia and polyuria.  Genitourinary: Negative for difficulty urinating, flank pain and hematuria.  Neurological: Positive for headaches. Negative for dizziness.  Hematological: Does not bruise/bleed easily.  Psychiatric/Behavioral: Positive for sleep disturbance.       Objective:   Physical Exam  Constitutional: She is oriented to person, place, and time. She appears well-developed and well-nourished. No distress.  HENT:  Head: Normocephalic and atraumatic.  Right Ear: Hearing, external ear and ear canal normal. Tympanic membrane is erythematous and bulging. No decreased hearing is noted.  Left Ear: Hearing, external ear and ear canal normal. Tympanic membrane is erythematous and bulging. No decreased hearing is noted.  Nose: Mucosal edema and rhinorrhea present. Right sinus exhibits maxillary sinus tenderness and frontal sinus tenderness. Left sinus exhibits maxillary sinus tenderness and frontal sinus tenderness.  Mouth/Throat: Uvula is midline and mucous membranes are normal. Posterior oropharyngeal edema and posterior oropharyngeal erythema present. No oropharyngeal exudate or tonsillar abscesses.  Eyes: Conjunctivae are normal. Pupils are equal, round, and reactive to light.  Neck: Normal range of motion. Neck supple.  Cardiovascular: Normal rate, regular rhythm, normal heart sounds and intact distal pulses.  No murmur heard. Pulmonary/Chest: Effort normal. No respiratory distress. She has decreased breath sounds in the left lower field. She has no wheezes. She has no rhonchi. She has no rales. She exhibits no tenderness.  Lymphadenopathy:    She has no cervical adenopathy.  Neurological: She is alert and oriented to person, place, and time. Coordination normal.  Skin: Skin is warm and dry. No rash noted. She is not diaphoretic. No erythema. No pallor.  Warm to the touch  Psychiatric: She has a normal mood  and affect. Her behavior is normal. Judgment and thought content normal.          Assessment & Plan:   1. Acute maxillary sinusitis, recurrence not specified   2. Cough     Sinusitis, acute, maxillary Augmentin 500/125mg  BID- she developed diarrhea with the 875/125, therefore will use lower dose. Flonase, increase fluids/rest/vit-c, 2000mg /day when feeling ill.  Cough Increase fluids/rest/vit-c, 2000mg /day when not feeling well. Please take medications as directed and if symptoms persist after antibiotic completed, please call clinic. To ensure adequate rest, work excuse provided until 07/13/17.    FOLLOW-UP:  Return if symptoms worsen or fail to improve.

## 2017-07-07 NOTE — Patient Instructions (Signed)
Sinusitis, Adult Sinusitis is soreness and inflammation of your sinuses. Sinuses are hollow spaces in the bones around your face. Your sinuses are located:  Around your eyes.  In the middle of your forehead.  Behind your nose.  In your cheekbones.  Your sinuses and nasal passages are lined with a stringy fluid (mucus). Mucus normally drains out of your sinuses. When your nasal tissues become inflamed or swollen, the mucus can become trapped or blocked so air cannot flow through your sinuses. This allows bacteria, viruses, and funguses to grow, which leads to infection. Sinusitis can develop quickly and last for 7?10 days (acute) or for more than 12 weeks (chronic). Sinusitis often develops after a cold. What are the causes? This condition is caused by anything that creates swelling in the sinuses or stops mucus from draining, including:  Allergies.  Asthma.  Bacterial or viral infection.  Abnormally shaped bones between the nasal passages.  Nasal growths that contain mucus (nasal polyps).  Narrow sinus openings.  Pollutants, such as chemicals or irritants in the air.  A foreign object stuck in the nose.  A fungal infection. This is rare.  What increases the risk? The following factors may make you more likely to develop this condition:  Having allergies or asthma.  Having had a recent cold or respiratory tract infection.  Having structural deformities or blockages in your nose or sinuses.  Having a weak immune system.  Doing a lot of swimming or diving.  Overusing nasal sprays.  Smoking.  What are the signs or symptoms? The main symptoms of this condition are pain and a feeling of pressure around the affected sinuses. Other symptoms include:  Upper toothache.  Earache.  Headache.  Bad breath.  Decreased sense of smell and taste.  A cough that may get worse at night.  Fatigue.  Fever.  Thick drainage from your nose. The drainage is often green and  it may contain pus (purulent).  Stuffy nose or congestion.  Postnasal drip. This is when extra mucus collects in the throat or back of the nose.  Swelling and warmth over the affected sinuses.  Sore throat.  Sensitivity to light.  How is this diagnosed? This condition is diagnosed based on symptoms, a medical history, and a physical exam. To find out if your condition is acute or chronic, your health care provider may:  Look in your nose for signs of nasal polyps.  Tap over the affected sinus to check for signs of infection.  View the inside of your sinuses using an imaging device that has a light attached (endoscope).  If your health care provider suspects that you have chronic sinusitis, you may also:  Be tested for allergies.  Have a sample of mucus taken from your nose (nasal culture) and checked for bacteria.  Have a mucus sample examined to see if your sinusitis is related to an allergy.  If your sinusitis does not respond to treatment and it lasts longer than 8 weeks, you may have an MRI or CT scan to check your sinuses. These scans also help to determine how severe your infection is. In rare cases, a bone biopsy may be done to rule out more serious types of fungal sinus disease. How is this treated? Treatment for sinusitis depends on the cause and whether your condition is chronic or acute. If a virus is causing your sinusitis, your symptoms will go away on their own within 10 days. You may be given medicines to relieve your symptoms,   including:  Topical nasal decongestants. They shrink swollen nasal passages and let mucus drain from your sinuses.  Antihistamines. These drugs block inflammation that is triggered by allergies. This can help to ease swelling in your nose and sinuses.  Topical nasal corticosteroids. These are nasal sprays that ease inflammation and swelling in your nose and sinuses.  Nasal saline washes. These rinses can help to get rid of thick mucus in  your nose.  If your condition is caused by bacteria, you will be given an antibiotic medicine. If your condition is caused by a fungus, you will be given an antifungal medicine. Surgery may be needed to correct underlying conditions, such as narrow nasal passages. Surgery may also be needed to remove polyps. Follow these instructions at home: Medicines  Take, use, or apply over-the-counter and prescription medicines only as told by your health care provider. These may include nasal sprays.  If you were prescribed an antibiotic medicine, take it as told by your health care provider. Do not stop taking the antibiotic even if you start to feel better. Hydrate and Humidify  Drink enough water to keep your urine clear or pale yellow. Staying hydrated will help to thin your mucus.  Use a cool mist humidifier to keep the humidity level in your home above 50%.  Inhale steam for 10-15 minutes, 3-4 times a day or as told by your health care provider. You can do this in the bathroom while a hot shower is running.  Limit your exposure to cool or dry air. Rest  Rest as much as possible.  Sleep with your head raised (elevated).  Make sure to get enough sleep each night. General instructions  Apply a warm, moist washcloth to your face 3-4 times a day or as told by your health care provider. This will help with discomfort.  Wash your hands often with soap and water to reduce your exposure to viruses and other germs. If soap and water are not available, use hand sanitizer.  Do not smoke. Avoid being around people who are smoking (secondhand smoke).  Keep all follow-up visits as told by your health care provider. This is important. Contact a health care provider if:  You have a fever.  Your symptoms get worse.  Your symptoms do not improve within 10 days. Get help right away if:  You have a severe headache.  You have persistent vomiting.  You have pain or swelling around your face or  eyes.  You have vision problems.  You develop confusion.  Your neck is stiff.  You have trouble breathing. This information is not intended to replace advice given to you by your health care provider. Make sure you discuss any questions you have with your health care provider. Document Released: 07/07/2005 Document Revised: 03/02/2016 Document Reviewed: 05/02/2015 Elsevier Interactive Patient Education  2017 [employer]   Cough, Adult Coughing is a reflex that clears your throat and your airways. Coughing helps to heal and protect your lungs. It is normal to cough occasionally, but a cough that happens with other symptoms or lasts a long time may be a sign of a condition that needs treatment. A cough may last only 2-3 weeks (acute), or it may last longer than 8 weeks (chronic). What are the causes? Coughing is commonly caused by:  Breathing in substances that irritate your lungs.  A viral or bacterial respiratory infection.  Allergies.  Asthma.  Postnasal drip.  Smoking.  Acid backing up from the stomach into the esophagus (  gastroesophageal reflux).  Certain medicines.  Chronic lung problems, including COPD (or rarely, lung cancer).  Other medical conditions such as heart failure.  Follow these instructions at home: Pay attention to any changes in your symptoms. Take these actions to help with your discomfort:  Take medicines only as told by your health care provider. ? If you were prescribed an antibiotic medicine, take it as told by your health care provider. Do not stop taking the antibiotic even if you start to feel better. ? Talk with your health care provider before you take a cough suppressant medicine.  Drink enough fluid to keep your urine clear or pale yellow.  If the air is dry, use a cold steam vaporizer or humidifier in your bedroom or your home to help loosen secretions.  Avoid anything that causes you to cough at work or at home.  If your cough is  worse at night, try sleeping in a semi-upright position.  Avoid cigarette smoke. If you smoke, quit smoking. If you need help quitting, ask your health care provider.  Avoid caffeine.  Avoid alcohol.  Rest as needed.  Contact a health care provider if:  You have new symptoms.  You cough up pus.  Your cough does not get better after 2-3 weeks, or your cough gets worse.  You cannot control your cough with suppressant medicines and you are losing sleep.  You develop pain that is getting worse or pain that is not controlled with pain medicines.  You have a fever.  You have unexplained weight loss.  You have night sweats. Get help right away if:  You cough up blood.  You have difficulty breathing.  Your heartbeat is very fast. This information is not intended to replace advice given to you by your health care provider. Make sure you discuss any questions you have with your health care provider. Document Released: 01/03/2011 Document Revised: 12/13/2015 Document Reviewed: 09/13/2014 Elsevier Interactive Patient Education  2017 [employer]   Increase fluids/rest/vit-c, 2000mg /day when not feeling well. Please take medications as directed and if symptoms persist after antibiotic completed, please call clinic. To ensure adequate rest, work excuse provided until 07/13/17. FEEL BETTER!

## 2017-07-07 NOTE — Assessment & Plan Note (Signed)
Increase fluids/rest/vit-c, 2000mg /day when not feeling well. Please take medications as directed and if symptoms persist after antibiotic completed, please call clinic. To ensure adequate rest, work excuse provided until 07/13/17.

## 2017-08-27 IMAGING — DX DG CHEST 2V
2 series · 2 of 2 positions shown · non-contrast
Comparison: 07/29/2012

CLINICAL DATA: Chest tightness.  Shortness of breath.

EXAM:
CHEST  2 VIEW

[w chest pa]
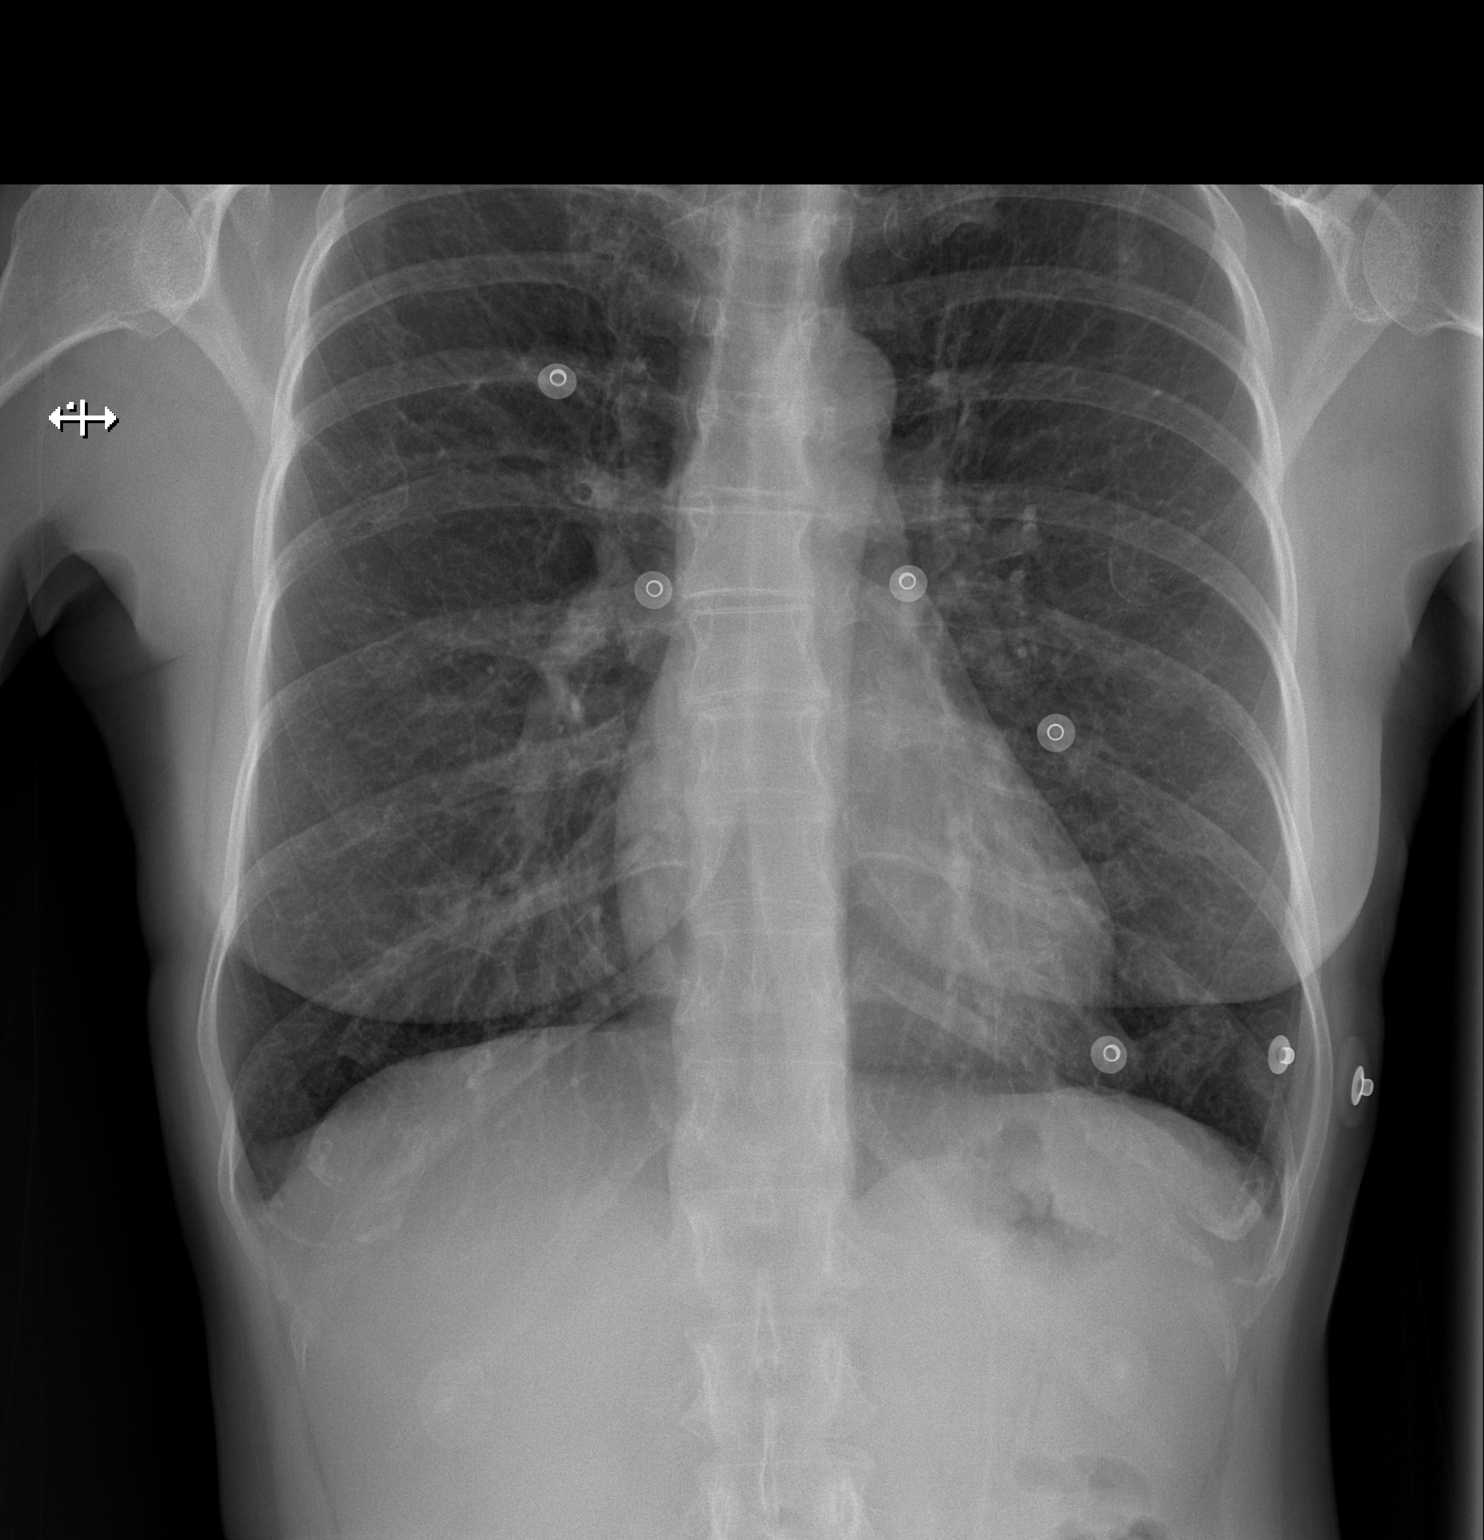

[w chest lat]
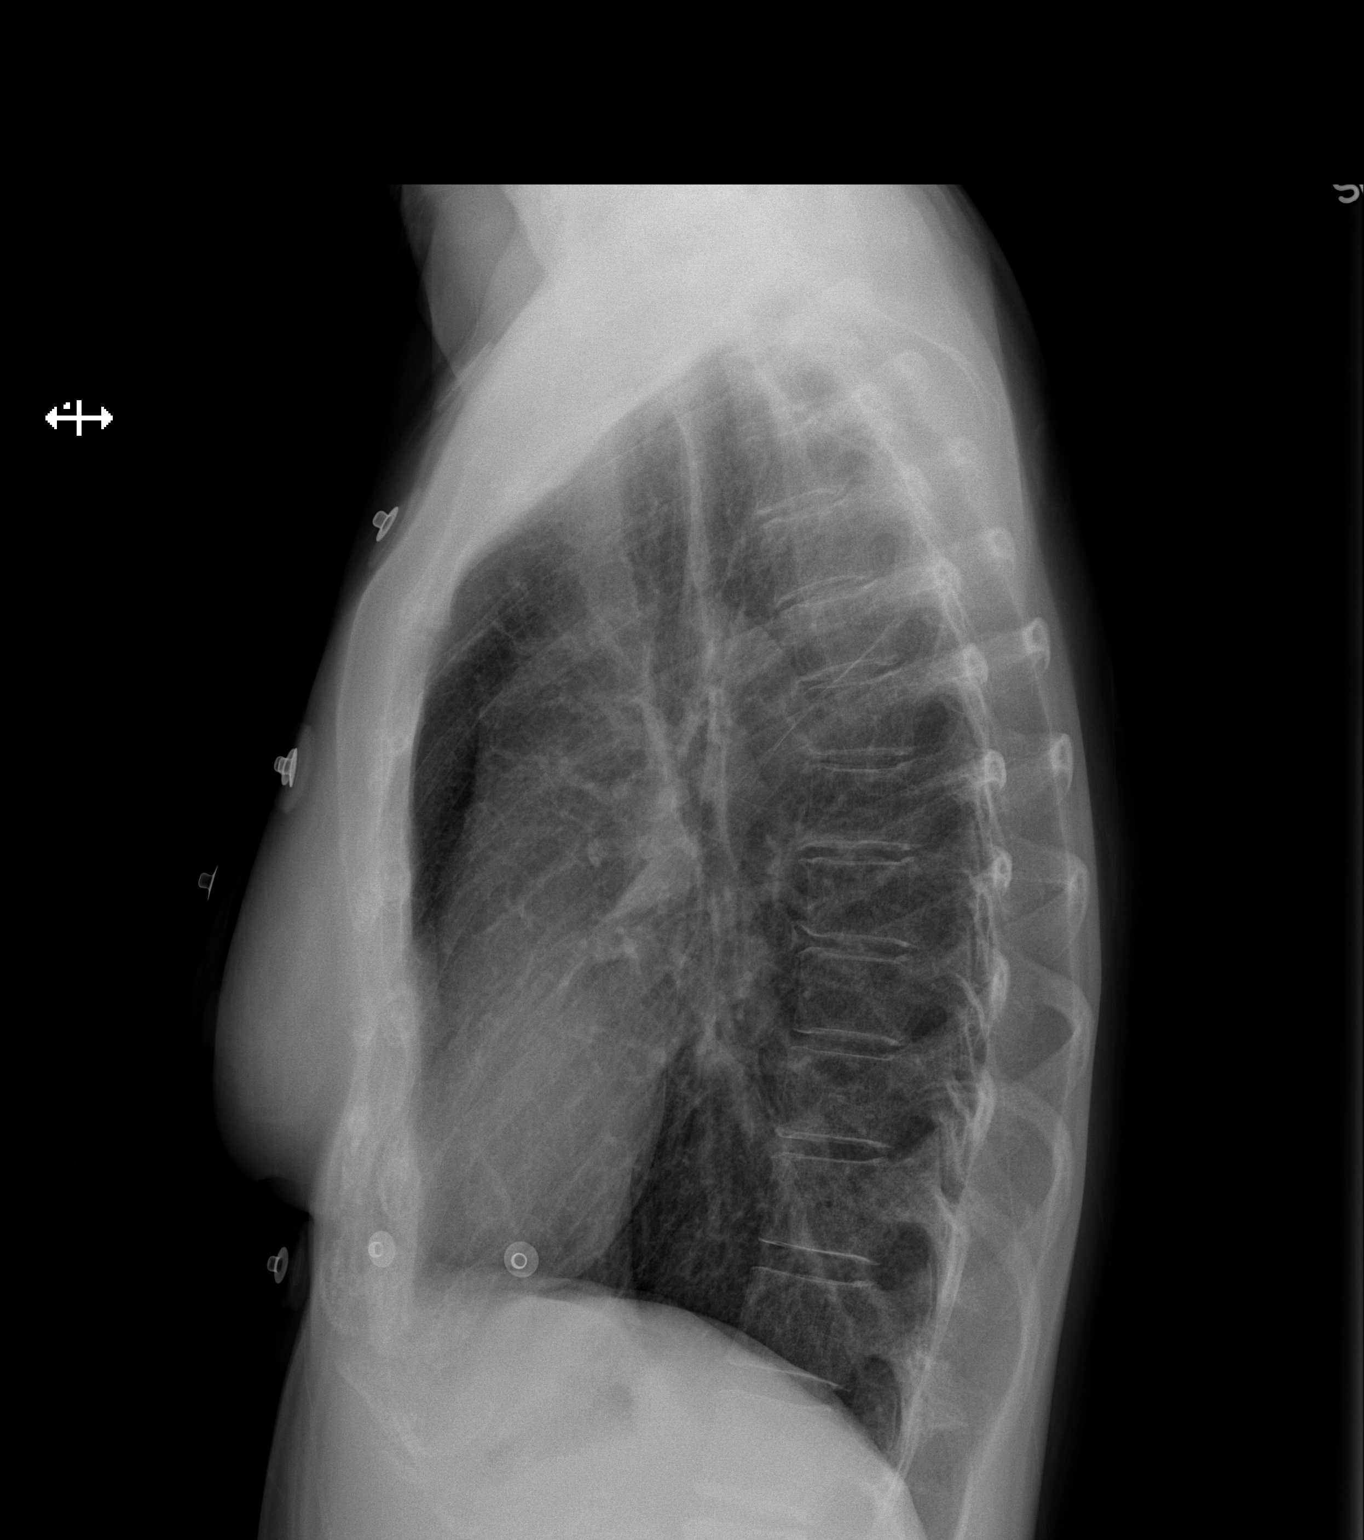

[2 of 2 positions shown; findings below may reference images not displayed]

FINDINGS: The heart size and mediastinal contours are within normal limits.
Increase lung volumes with coarsened interstitial markings. No
airspace opacities identified. The visualized skeletal structures
are unremarkable.
IMPRESSION: No active cardiopulmonary disease.

## 2017-10-26 NOTE — Progress Notes (Addendum)
Subjective:    Patient ID: Jessica Bailey, female    DOB: August 04, 1960, 57 y.o.   MRN: [id number]  HPI:  Jessica Bailey presents with thick/yellow nasal drainage, non-productive cough, fatigue, frontal HA (9/10), sore throat (1/10), and wheezing, that all started >2 weeks ago and have been steadily worsening.   She has been using Flonase and Claritin without sx relief. She denies CP/dsypnea/palpitations/fever/night sweats/N/V/D. She denies tobacco use. She had similar sx's in Dec 2018 that were successfully treated with Augmentin- last ABX she has been on.  Patient Care Team    Relationship Specialty Notifications Start End  Thomasene Lotpalski, Deborah, DO PCP - General Family Medicine  02/19/16     Patient Active Problem List   Diagnosis Date Noted  . Cough 07/07/2017  . Sinusitis, acute, maxillary 09/22/2016  . HLD (hyperlipidemia) 02/20/2016  . h/o Sinusitis, chronic 02/20/2016  . Personal history of gout 02/20/2016  . Routine general medical examination at a health care facility 02/20/2016  . Screening for colon cancer 02/20/2016  . Environmental allergies 10/12/2013     Past Medical History:  Diagnosis Date  . Allergy   . Anxiety      Past Surgical History:  Procedure Laterality Date  . CESAREAN SECTION    . NASAL SINUS SURGERY       Family History  Problem Relation Age of Onset  . Cancer Mother        oat cell carcinoma-metastatic  . Heart disease Sister   . Drug abuse Sister   . COPD Father        emphysema  . Hyperlipidemia Father   . Stroke Paternal Grandmother      Social History   Substance and Sexual Activity  Drug Use No     Social History   Substance and Sexual Activity  Alcohol Use Yes  . Alcohol/week: 1.8 oz  . Types: 3 Glasses of wine per week     Social History   Tobacco Use  Smoking Status Former Smoker  . Years: 2.00  Smokeless Tobacco Never Used     Outpatient Encounter Medications as of 10/27/2017  Medication Sig  . aspirin 81  MG chewable tablet Chew 1 tablet (81 mg total) by mouth daily.  . fluticasone (FLONASE) 50 MCG/ACT nasal spray Place 2 sprays into both nostrils daily.  Marland Kitchen. loratadine (CLARITIN) 10 MG tablet Take 10 mg by mouth daily.  . [DISCONTINUED] fluticasone (FLONASE) 50 MCG/ACT nasal spray Place 2 sprays into both nostrils daily.  Marland Kitchen. amoxicillin-clavulanate (AUGMENTIN) 875-125 MG tablet Take 1 tablet by mouth 2 (two) times daily.  . predniSONE (DELTASONE) 10 MG tablet 4 tablets daily for 2 days,  3 tablets daily for 2 days,  2 tablets daily for 2 days,  then 1 tablet daily for 2 days  . [DISCONTINUED] amoxicillin-clavulanate (AUGMENTIN) 500-125 MG tablet Take 1 tablet (500 mg total) by mouth 2 (two) times daily with a meal.  . [DISCONTINUED] benzonatate (TESSALON) 200 MG capsule Take 1 capsule (200 mg total) by mouth 2 (two) times daily as needed for cough.   No facility-administered encounter medications on file as of 10/27/2017.     Allergies: Codeine  Body mass index is 20.36 kg/m.  Blood pressure 97/63, pulse 71, temperature 98.4 F (36.9 C), temperature source Oral, height 4' 10.5" (1.486 m), weight 99 lb 1.6 oz (45 kg), SpO2 100 %.  Review of Systems  Constitutional: Positive for fatigue. Negative for activity change, appetite change, chills, diaphoresis, fever and unexpected weight change.  HENT: Positive for congestion, postnasal drip, rhinorrhea, sinus pressure, sinus pain, sneezing, sore throat and voice change. Negative for trouble swallowing.   Eyes: Negative for visual disturbance.  Respiratory: Positive for cough and wheezing. Negative for chest tightness, shortness of breath and stridor.   Cardiovascular: Negative for chest pain, palpitations and leg swelling.  Gastrointestinal: Negative for abdominal distention, abdominal pain, blood in stool, constipation, diarrhea, nausea and vomiting.  Genitourinary: Negative for difficulty urinating and flank pain.  Neurological: Positive for  headaches. Negative for dizziness.  Hematological: Does not bruise/bleed easily.  Psychiatric/Behavioral: Positive for sleep disturbance.       Objective:   Physical Exam  Constitutional: She is oriented to person, place, and time. She appears well-developed and well-nourished. No distress.  HENT:  Head: Normocephalic and atraumatic.  Right Ear: External ear normal. Tympanic membrane is erythematous and bulging. No decreased hearing is noted.  Left Ear: External ear normal. Tympanic membrane is erythematous and bulging. No decreased hearing is noted.  Nose: Mucosal edema and rhinorrhea present. Right sinus exhibits maxillary sinus tenderness and frontal sinus tenderness. Left sinus exhibits maxillary sinus tenderness and frontal sinus tenderness.  Mouth/Throat: Uvula is midline and mucous membranes are normal. Posterior oropharyngeal edema and posterior oropharyngeal erythema present. No oropharyngeal exudate or tonsillar abscesses.  Eyes: Pupils are equal, round, and reactive to light. Conjunctivae are normal.  Neck: Normal range of motion. Neck supple.  Cardiovascular: Normal rate, regular rhythm, normal heart sounds and intact distal pulses.  No murmur heard. Pulmonary/Chest: Effort normal and breath sounds normal. No respiratory distress. She has no wheezes. She has no rales. She exhibits no tenderness.  Lymphadenopathy:    She has no cervical adenopathy.  Neurological: She is alert and oriented to person, place, and time.  Skin: Skin is warm and dry. No rash noted. She is not diaphoretic. No erythema. No pallor.  Psychiatric: She has a normal mood and affect. Her behavior is normal. Judgment and thought content normal.  Nursing note and vitals reviewed.     Assessment & Plan:   1. Acute maxillary sinusitis, recurrence not specified     Sinusitis, acute, maxillary Please take Augmentin and Prednisone as directed. Please take Flonase as directed. Increase fluids/rest/vit  c-2,000mg /day. Alternate OTC Acetaminophen and Ibuprofen as needed for pain/fever. If symptoms persist after antibiotic completed, please call clinic.    FOLLOW-UP:  Return if symptoms worsen or fail to improve.

## 2017-10-27 ENCOUNTER — Encounter: Payer: Self-pay | Admitting: Adult Health

## 2017-10-27 ENCOUNTER — Ambulatory Visit: Payer: BC Managed Care – PPO | Admitting: Adult Health

## 2017-10-27 VITALS — BP 97/63 | HR 71 | Temp 98.4°F | Ht 58.5 in | Wt 99.1 lb

## 2017-10-27 DIAGNOSIS — J0100 Acute maxillary sinusitis, unspecified: Secondary | ICD-10-CM | POA: Diagnosis not present

## 2017-10-27 MED ORDER — PREDNISONE 10 MG PO TABS: ORAL_TABLET | ORAL | 0 refills | Status: DC

## 2017-10-27 MED ORDER — FLUTICASONE PROPIONATE 50 MCG/ACT NA SUSP: 2.0000 | Freq: Every day | NASAL | 6 refills | Status: DC

## 2017-10-27 MED ORDER — AMOXICILLIN-POT CLAVULANATE 875-125 MG PO TABS: 1.0000 | ORAL_TABLET | Freq: Two times a day (BID) | ORAL | 0 refills | Status: DC

## 2017-10-27 NOTE — Assessment & Plan Note (Signed)
Please take Augmentin and Prednisone as directed. Please take Flonase as directed. Increase fluids/rest/vit c-2,000mg /day. Alternate OTC Acetaminophen and Ibuprofen as needed for pain/fever. If symptoms persist after antibiotic completed, please call clinic.

## 2017-10-27 NOTE — Patient Instructions (Signed)
Sinusitis, Adult Sinusitis is soreness and inflammation of your sinuses. Sinuses are hollow spaces in the bones around your face. They are located:  Around your eyes.  In the middle of your forehead.  Behind your nose.  In your cheekbones.  Your sinuses and nasal passages are lined with a stringy fluid (mucus). Mucus normally drains out of your sinuses. When your nasal tissues get inflamed or swollen, the mucus can get trapped or blocked so air cannot flow through your sinuses. This lets bacteria, viruses, and funguses grow, and that leads to infection. Follow these instructions at home: Medicines  Take, use, or apply over-the-counter and prescription medicines only as told by your doctor. These may include nasal sprays.  If you were prescribed an antibiotic medicine, take it as told by your doctor. Do not stop taking the antibiotic even if you start to feel better. Hydrate and Humidify  Drink enough water to keep your pee (urine) clear or pale yellow.  Use a cool mist humidifier to keep the humidity level in your home above 50%.  Breathe in steam for 10-15 minutes, 3-4 times a day or as told by your doctor. You can do this in the bathroom while a hot shower is running.  Try not to spend time in cool or dry air. Rest  Rest as much as possible.  Sleep with your head raised (elevated).  Make sure to get enough sleep each night. General instructions  Put a warm, moist washcloth on your face 3-4 times a day or as told by your doctor. This will help with discomfort.  Wash your hands often with soap and water. If there is no soap and water, use hand sanitizer.  Do not smoke. Avoid being around people who are smoking (secondhand smoke).  Keep all follow-up visits as told by your doctor. This is important. Contact a doctor if:  You have a fever.  Your symptoms get worse.  Your symptoms do not get better within 10 days. Get help right away if:  You have a very bad  headache.  You cannot stop throwing up (vomiting).  You have pain or swelling around your face or eyes.  You have trouble seeing.  You feel confused.  Your neck is stiff.  You have trouble breathing. This information is not intended to replace advice given to you by your health care provider. Make sure you discuss any questions you have with your health care provider. Document Released: 12/24/2007 Document Revised: 03/02/2016 Document Reviewed: 05/02/2015 Elsevier Interactive Patient Education  2018 [employer].  Please take Augmentin and Prednisone as directed. Please take Flonase as directed. Increase fluids/rest/vit c-2,000mg /day. Alternate OTC Acetaminophen and Ibuprofen as needed for pain/fever. If symptoms persist after antibiotic completed, please call clinic. FEEL BETTER!

## 2017-11-02 LAB — HM COLONOSCOPY

## 2018-03-11 ENCOUNTER — Ambulatory Visit: Payer: BC Managed Care – PPO | Admitting: Family Medicine

## 2018-03-11 ENCOUNTER — Encounter: Payer: Self-pay | Admitting: Family Medicine

## 2018-03-11 VITALS — BP 112/80 | HR 77 | Temp 98.4°F | Ht 59.0 in | Wt 100.0 lb

## 2018-03-11 DIAGNOSIS — H6983 Other specified disorders of Eustachian tube, bilateral: Secondary | ICD-10-CM | POA: Diagnosis not present

## 2018-03-11 DIAGNOSIS — J3089 Other allergic rhinitis: Secondary | ICD-10-CM | POA: Diagnosis not present

## 2018-03-11 DIAGNOSIS — J0141 Acute recurrent pansinusitis: Secondary | ICD-10-CM

## 2018-03-11 DIAGNOSIS — H900 Conductive hearing loss, bilateral: Secondary | ICD-10-CM

## 2018-03-11 MED ORDER — MONTELUKAST SODIUM 10 MG PO TABS: 10.0000 mg | ORAL_TABLET | Freq: Every day | ORAL | 0 refills | Status: AC

## 2018-03-11 MED ORDER — METHYLPREDNISOLONE ACETATE 40 MG/ML IJ SUSP
40.0000 mg | Freq: Once | INTRAMUSCULAR | Status: AC
  Administered 2018-03-11: 40 mg via INTRAMUSCULAR

## 2018-03-11 MED ORDER — PREDNISONE 10 MG (21) PO TBPK: ORAL_TABLET | ORAL | 0 refills | Status: DC

## 2018-03-11 MED ORDER — DEXAMETHASONE SODIUM PHOSPHATE 4 MG/ML IJ SOLN
4.0000 mg | Freq: Once | INTRAMUSCULAR | Status: AC
  Administered 2018-03-11: 4 mg via INTRAMUSCULAR

## 2018-03-11 NOTE — Patient Instructions (Addendum)
You can use over-the-counter afrin nasal spray for up to 3 days (NO longer than that) which will help acutely with nasal drainage/ congestion short term.     Also, sterile saline nasal rinses, such as Neil med or AYR sinus rinses, can be very helpful and should be done twice daily- especially throughout the allergy season.   Remember you should use distilled water or previously boiled water to do this.  Then you may use over-the-counter Flonase 1 spray each nostril twice daily after sinus rinses.  You can do this in addition to taking any Allegra or Claritin or Zyrtec etc. that you may be taking daily.  If your eyes tend to get an itchy or irritated feeling when your seasonal allergies get bad, you can use Naphcon-A over-the-counter eyedrops as needed  

## 2018-03-11 NOTE — Progress Notes (Signed)
Acute Care Office visit  Assessment and plan:  1. Acute recurrent pansinusitis   2. Environmental and seasonal allergies   3. ETD (Eustachian tube dysfunction), bilateral   4. Conductive hearing loss, bilateral     1. Chronic Sinusitis - Referral to Allergist provided today.  - Viral vs Allergic vs Bacterial causes for pt's symptoms reveiwed.    - Supportive care and various OTC medications discussed in addition to any prescribed.  - Advised the patient to begin using AYR or Neilmed sinus rinses BID followed by flonase BID (one spray to each nostril).  Advised that the patient may also incorporate allegra or claritin PRN.   - Steroid prescription recommended today.  Injection provided today, and oral prescription provided to begin tomorrow.  2. Follow-Up - Call or RTC if new symptoms, or if no improvement or worse over next several days.   - Will consider ABX if sx continue or worsen.  - Otherwise return for regularly scheduled chronic follow-up and CPE.   Meds ordered this encounter  Medications  . predniSONE (STERAPRED UNI-PAK 21 TAB) 10 MG (21) TBPK tablet    Sig: 12 day taper pack, use as directed    Dispense:  1 tablet    Refill:  0  . montelukast (SINGULAIR) 10 MG tablet    Sig: Take 1 tablet (10 mg total) by mouth at bedtime.    Dispense:  90 tablet    Refill:  0  . dexamethasone (DECADRON) injection 4 mg  . methylPREDNISolone acetate (DEPO-MEDROL) injection 40 mg    Medications Discontinued During This Encounter  Medication Reason  . amoxicillin-clavulanate (AUGMENTIN) 875-125 MG tablet Completed Course  . predniSONE (DELTASONE) 10 MG tablet Completed Course     Orders Placed This Encounter  Procedures  . Ambulatory referral to Allergy    Gross side effects, risk and benefits, and alternatives of medications discussed with patient.  Patient is aware that all medications have potential side effects and we are unable to predict every sideeffect or  drug-drug interaction that may occur.  Expresses verbal understanding and consents to current therapy plan and treatment regiment.   Education and routine counseling performed. Handouts provided.  Anticipatory guidance and routine counseling done re: condition, txmnt options and need for follow up. All questions of patient's were answered.  Return for Mid November for CPE with fasting blood work same day..  Please see AVS handed out to patient at the end of our visit for additional patient instructions/ counseling done pertaining to today's office visit.  Note:  This document was partially repared using Dragon voice recognition software and may include unintentional dictation errors.  This document serves as a record of services personally performed by Thomasene Loteborah Lindley Stachnik, DO. It was created on her behalf by Peggye FothergillKatherine Galloway, a trained medical scribe. The creation of this record is based on the scribe's personal observations and the provider's statements to them.   I have reviewed the above medical documentation for accuracy and completeness and I concur.  Thomasene LotDeborah Chaquita Basques 03/11/18 8:57 PM    Subjective:    Chief Complaint  Patient presents with  . Sinus Problem    HPI:  Pt presents with Sx for about two weeks   C/o: Tightness in her sinuses and ears, especially her left ear.  Notes she's been harder of hearing.  Has had some small amount of chills, but "not really."  Feels she can't get her sinuses to drain.  Confirms post nasal drip.  Notes that  she has so much pressure that it's unbearable.  Denies: Largely denies chills.  Denies one-sided face pain.     For symptoms patient has tried:  Claritin, Flonase, and saline spray.  Overall getting: Feels that her symptoms are not changing; not any worse or any better.   Patient Care Team    Relationship Specialty Notifications Start End  Thomasene Lot, DO PCP - General Family Medicine  02/19/16     Past medical history,  Surgical history, Family history reviewed and noted below, Social history, Allergies, and Medications have been entered into the medical record, reviewed and changed as needed.   Allergies  Allergen Reactions  . Codeine     Review of Systems: - see above HPI for pertinent positives General:   No F/C, wt loss Pulm:   No DIB, pleuritic chest pain Card:  No CP, palpitations Abd:  No n/v/d or pain Ext:  No inc edema from baseline   Objective:   Blood pressure 112/80, pulse 77, temperature 98.4 F (36.9 C), height 4\' 11"  (1.499 m), weight 100 lb (45.4 kg), SpO2 99 %. Body mass index is 20.2 kg/m. General: Well Developed, well nourished, appropriate for stated age.  Neuro: Alert and oriented x3, extra-ocular muscles intact, sensation grossly intact.  HEENT: Normocephalic, atraumatic, pupils equal round reactive to light, neck supple, no masses, no painful lymphadenopathy, TM's intact B/L, mild protrusion of both TM's bilaterally but no further acute findings. Nares- patent, turbinates erythematous, copious clear d/c, OP- clear, mild erythema, No TTP sinuses Skin: Warm and dry, no gross rash. Cardiac: RRR, S1 S2,  no murmurs rubs or gallops.  Respiratory: ECTA B/L and A/P, Not using accessory muscles, speaking in full sentences- unlabored. Vascular:  No gross lower ext edema, cap RF less 2 sec. Psych: No HI/SI, judgement and insight good, Euthymic mood. Full Affect.

## 2018-04-29 ENCOUNTER — Ambulatory Visit: Payer: BC Managed Care – PPO | Admitting: Allergy & Immunology

## 2018-05-11 ENCOUNTER — Ambulatory Visit (INDEPENDENT_AMBULATORY_CARE_PROVIDER_SITE_OTHER): Payer: BC Managed Care – PPO | Admitting: Adult Health

## 2018-05-11 ENCOUNTER — Encounter: Payer: Self-pay | Admitting: Adult Health

## 2018-05-11 VITALS — BP 106/68 | HR 83 | Temp 98.4°F | Ht 59.0 in | Wt 99.9 lb

## 2018-05-11 DIAGNOSIS — R300 Dysuria: Secondary | ICD-10-CM | POA: Diagnosis not present

## 2018-05-11 DIAGNOSIS — R8290 Unspecified abnormal findings in urine: Secondary | ICD-10-CM | POA: Diagnosis not present

## 2018-05-11 LAB — POCT URINALYSIS DIPSTICK
Bilirubin, UA: NEGATIVE
Glucose, UA: NEGATIVE
Ketones, UA: NEGATIVE
NITRITE UA: NEGATIVE
PH UA: 7 (ref 5.0–8.0)
Protein, UA: NEGATIVE
SPEC GRAV UA: 1.01 (ref 1.010–1.025)
UROBILINOGEN UA: 0.2 U/dL

## 2018-05-11 MED ORDER — NITROFURANTOIN MONOHYD MACRO 100 MG PO CAPS: 100.0000 mg | ORAL_CAPSULE | Freq: Two times a day (BID) | ORAL | 0 refills | Status: DC

## 2018-05-11 NOTE — Patient Instructions (Addendum)
Urinary Tract Infection, Adult A urinary tract infection (UTI) is an infection of any part of the urinary tract. The urinary tract includes the:  Kidneys.  Ureters.  Bladder.  Urethra.  These organs make, store, and get rid of pee (urine) in the body. Follow these instructions at home:  Take over-the-counter and prescription medicines only as told by your doctor.  If you were prescribed an antibiotic medicine, take it as told by your doctor. Do not stop taking the antibiotic even if you start to feel better.  Avoid the following drinks: ? Alcohol. ? Caffeine. ? Tea. ? Carbonated drinks.  Drink enough fluid to keep your pee clear or pale yellow.  Keep all follow-up visits as told by your doctor. This is important.  Make sure to: ? Empty your bladder often and completely. Do not to hold pee for long periods of time. ? Empty your bladder before and after sex. ? Wipe from front to back after a bowel movement if you are female. Use each tissue one time when you wipe. Contact a doctor if:  You have back pain.  You have a fever.  You feel sick to your stomach (nauseous).  You throw up (vomit).  Your symptoms do not get better after 3 days.  Your symptoms go away and then come back. Get help right away if:  You have very bad back pain.  You have very bad lower belly (abdominal) pain.  You are throwing up and cannot keep down any medicines or water. This information is not intended to replace advice given to you by your health care provider. Make sure you discuss any questions you have with your health care provider. Document Released: 12/24/2007 Document Revised: 12/13/2015 Document Reviewed: 05/28/2015 Elsevier Interactive Patient Education  2018 [employer].  Please take Nitrofurantoin (Macrobid) 100mg  twice daily for 7 days. Push fluids. Take OTC Acetaminophen as directed for pain/fever. We will call you when urine culture/sensitivity results are  available. FEEL BETTER!

## 2018-05-11 NOTE — Assessment & Plan Note (Signed)
UA Blood Small Nit Neg Leu Small Specimen sent for C/S Started Nitrofurantoin (Macrobid) 100mg  BID x 7 d Will call when C/S results available

## 2018-05-11 NOTE — Progress Notes (Signed)
Subjective:    Patient ID: Jessica Bailey, female    DOB: 01/12/1961, 57 y.o.   MRN: [id number]  HPI:  Jessica Bailey presents with dysuria, frequency, and bil flank pain (5/10) that started 24 hrs ago. She has increased fluids and been taking OTC AZO. She also reports mild nausea without vomiting,chills, and hematuria. She denies abdominal pain.  Patient Care Team    Relationship Specialty Notifications Start End  Thomasene Lot, DO PCP - General Family Medicine  02/19/16     Patient Active Problem List   Diagnosis Date Noted  . Dysuria 05/11/2018  . Abnormal urinalysis 05/11/2018  . Acute recurrent pansinusitis 03/11/2018  . ETD (Eustachian tube dysfunction), bilateral 03/11/2018  . Conductive hearing loss, bilateral 03/11/2018  . Cough 07/07/2017  . Sinusitis, acute, maxillary 09/22/2016  . HLD (hyperlipidemia) 02/20/2016  . h/o Sinusitis, chronic 02/20/2016  . Personal history of gout 02/20/2016  . Routine general medical examination at a health care facility 02/20/2016  . Screening for colon cancer 02/20/2016  . Environmental and seasonal allergies 10/12/2013     Past Medical History:  Diagnosis Date  . Allergy   . Anxiety      Past Surgical History:  Procedure Laterality Date  . CESAREAN SECTION    . NASAL SINUS SURGERY       Family History  Problem Relation Age of Onset  . Cancer Mother        oat cell carcinoma-metastatic  . Heart disease Sister   . Drug abuse Sister   . COPD Father        emphysema  . Hyperlipidemia Father   . Stroke Paternal Grandmother      Social History   Substance and Sexual Activity  Drug Use No     Social History   Substance and Sexual Activity  Alcohol Use Yes  . Alcohol/week: 3.0 standard drinks  . Types: 3 Glasses of wine per week     Social History   Tobacco Use  Smoking Status Former Smoker  . Years: 2.00  Smokeless Tobacco Never Used     Outpatient Encounter Medications as of 05/11/2018   Medication Sig  . aspirin 81 MG chewable tablet Chew 1 tablet (81 mg total) by mouth daily.  . fluticasone (FLONASE) 50 MCG/ACT nasal spray Place 2 sprays into both nostrils daily.  . montelukast (SINGULAIR) 10 MG tablet Take 1 tablet (10 mg total) by mouth at bedtime.  . nitrofurantoin, macrocrystal-monohydrate, (MACROBID) 100 MG capsule Take 1 capsule (100 mg total) by mouth 2 (two) times daily.  . [DISCONTINUED] loratadine (CLARITIN) 10 MG tablet Take 10 mg by mouth daily.  . [DISCONTINUED] predniSONE (STERAPRED UNI-PAK 21 TAB) 10 MG (21) TBPK tablet 12 day taper pack, use as directed   No facility-administered encounter medications on file as of 05/11/2018.     Allergies: Codeine  Body mass index is 20.18 kg/m.  Blood pressure 106/68, pulse 83, temperature 98.4 F (36.9 C), temperature source Oral, height 4\' 11"  (1.499 m), weight 99 lb 14.4 oz (45.3 kg), SpO2 100 %.     Review of Systems  Constitutional: Positive for fatigue. Negative for activity change, appetite change, chills, diaphoresis, fever and unexpected weight change.  HENT: Negative for congestion.   Eyes: Negative for visual disturbance.  Respiratory: Negative for cough, chest tightness, shortness of breath, wheezing and stridor.   Cardiovascular: Negative for chest pain, palpitations and leg swelling.  Gastrointestinal: Positive for nausea. Negative for abdominal distention, abdominal pain, blood in stool,  constipation, diarrhea and vomiting.  Endocrine: Negative for cold intolerance, heat intolerance, polydipsia, polyphagia and polyuria.  Genitourinary: Positive for dysuria, flank pain, frequency and hematuria. Negative for pelvic pain.  Neurological: Negative for dizziness and headaches.  Hematological: Does not bruise/bleed easily.  Psychiatric/Behavioral: Positive for sleep disturbance.       Objective:   Physical Exam  Constitutional: She is oriented to person, place, and time. She appears  well-developed and well-nourished. No distress.  HENT:  Head: Normocephalic and atraumatic.  Right Ear: External ear normal.  Left Ear: External ear normal.  Nose: Nose normal.  Mouth/Throat: Oropharynx is clear and moist.  Eyes: Pupils are equal, round, and reactive to light. Conjunctivae and EOM are normal.  Cardiovascular: Normal rate, regular rhythm, normal heart sounds and intact distal pulses.  No murmur heard. Pulmonary/Chest: Effort normal and breath sounds normal. No stridor. No respiratory distress. She has no wheezes. She has no rales. She exhibits no tenderness.  Abdominal: Soft. Bowel sounds are normal. She exhibits no distension and no mass. There is no tenderness. There is no rigidity, no rebound, no guarding, no CVA tenderness, no tenderness at McBurney's point and negative Murphy's sign. No hernia.  Neurological: She is alert and oriented to person, place, and time.  Skin: Skin is warm and dry. Capillary refill takes less than 2 seconds. No rash noted. She is not diaphoretic. No erythema. No pallor.  Psychiatric: She has a normal mood and affect. Her behavior is normal. Judgment and thought content normal.  Nursing note and vitals reviewed.     Assessment & Plan:   1. Dysuria   2. Abnormal urinalysis     Abnormal urinalysis UA Blood Small Nit Neg Leu Small Specimen sent for C/S Started Nitrofurantoin (Macrobid) 100mg  BID x 7 d Will call when C/S results available     FOLLOW-UP:  Return if symptoms worsen or fail to improve.

## 2018-05-15 LAB — CULTURE, URINE COMPREHENSIVE

## 2018-05-31 ENCOUNTER — Ambulatory Visit: Payer: BC Managed Care – PPO | Admitting: Allergy

## 2018-05-31 ENCOUNTER — Encounter: Payer: Self-pay | Admitting: Allergy

## 2018-05-31 VITALS — BP 124/72 | HR 88 | Temp 97.9°F | Resp 16 | Ht 58.5 in | Wt 98.0 lb

## 2018-05-31 DIAGNOSIS — K9049 Malabsorption due to intolerance, not elsewhere classified: Secondary | ICD-10-CM | POA: Insufficient documentation

## 2018-05-31 DIAGNOSIS — J310 Chronic rhinitis: Secondary | ICD-10-CM | POA: Diagnosis not present

## 2018-05-31 DIAGNOSIS — J329 Chronic sinusitis, unspecified: Secondary | ICD-10-CM | POA: Insufficient documentation

## 2018-05-31 MED ORDER — FLUTICASONE PROPIONATE 93 MCG/ACT NA EXHU: 2.0000 | INHALANT_SUSPENSION | Freq: Every day | NASAL | 5 refills | Status: DC

## 2018-05-31 NOTE — Assessment & Plan Note (Signed)
GI issues with dairy and gluten products. Tolerated limited lactose free milk.  Today's skin testing showed: negative to foods including dairy and wheat.  Based on clinical symptoms she most likely had lactose intolerance. Continue with lactose free dairy products.  Avoid gluten products.

## 2018-05-31 NOTE — Assessment & Plan Note (Addendum)
Perennial rhinitis symptoms with frequent sinus infections for the past 30 years and worsening in the fall. No previous allergy testing. Sinus surgery in the 1990s with some benefit. No recent ENT evaluation.  Today's skin testing showed: negative to environmental allergies.   Stop Singulair   Start Xhance 2 sprays daily. Demonstrated proper use. Stop Flonase.   Refer to ENT.

## 2018-05-31 NOTE — Progress Notes (Addendum)
New Patient Note  RE: Jessica Bailey MRN: [id number] DOB: 1961/02/11 Date of Office Visit: 05/31/2018  Referring provider: Thomasene Lot, DO Primary care provider: Thomasene Lot, DO  Chief Complaint: Frequent Sinus Infection (sinus infections occur 4 times or more a year. ) and Ear Problem (recurrent eustachian tube blockage. )  History of Present Illness: I had the pleasure of seeing Jessica Bailey for initial evaluation at the [facility] on 05/31/2018. She is a 57 y.o. female, who is referred here by Thomasene Lot, DO for the evaluation of frequent sinus infections and eustachian tube dysfunction.   She reports symptoms of frequent sinus infections, nasal congestion, ear fullness, sneezing, itchy/watery eyes. Symptoms have been going on for 30 years. The symptoms are present all year around with worsening in fall. Anosmia: no. Headache: yes. She has used Claritin, Singulair, Flonase 2 sprays with minimal improvement in symptoms. Sinus infections: 2 this past year. Previous work up includes: none. Previous ENT evaluation: not recently. Patient had sinus surgery in 1990s with some benefit.  Previous sinus imaging: no.  Assessment and Plan: Jessica Bailey is a 57 y.o. female with: Chronic rhinitis Perennial rhinitis symptoms with frequent sinus infections for the past 30 years and worsening in the fall. No previous allergy testing. Sinus surgery in the 1990s with some benefit. No recent ENT evaluation.  Today's skin testing showed: negative to environmental allergies.   Stop Singulair   Start Xhance 2 sprays daily. Demonstrated proper use. Stop Flonase.   Refer to ENT.  Food intolerance GI issues with dairy and gluten products. Tolerated limited lactose free milk.  Today's skin testing showed: negative to foods including dairy and wheat.  Based on clinical symptoms she most likely had lactose intolerance. Continue with lactose free dairy  products.  Avoid gluten products.   Frequent sinus infections 2-3 sinus infections per year. Some benefit from sinus surgery in 1990s. No recent ENT evaluation.  Today's testing was negative to environmental allergies.  Refer to ENT.  If ENT evaluation is negative and sinus infections still persistent then consider basic immune evaluation.  Return in about 3 months (around 08/31/2018).  Meds ordered this encounter  Medications  . Fluticasone Propionate (XHANCE) 93 MCG/ACT EXHU    Sig: Place 2 sprays into the nose daily.    Dispense:  32 mL    Refill:  5    [phone] (H)   Other allergy screening: Asthma: no Rhino conjunctivitis: yes Food allergy: intolerances  Dairy products causes bloating and diarrhea. Tolerates lactose free milk. Gluten causes bloating. Dietary History: patient has been eating other foods including limited dairy milk, eggs, peanut, treenuts, sesame, shellfish, seafood, soy, limited wheat, meats, fruits and vegetables.  Medication allergy: yes  Codeine - weakness Hymenoptera allergy: no Urticaria: no Eczema:yes resolved  History of recurrent infections suggestive of immunodeficency: no  Diagnostics: Skin Testing: Environmental allergy panel and select foods Negative test to: environmental allergies and select foods.  Results discussed with patient/family. Airborne Adult Perc - 05/31/18 1359    Time Antigen Placed  1359    Allergen Manufacturer  Waynette Buttery    Location  Back    Number of Test  59    Panel 1  Select    1. Control-Buffer 50% Glycerol  Negative    2. Control-Histamine 1 mg/ml  4+    3. Albumin saline  Negative    4. Bahia  Negative    5. [place]  Negative    6. Johnson  Negative  7. So [facility]  Negative    8. Meadow Fescue  Negative    9. Perennial Rye  Negative    10. Sweet Vernal  Negative    11. Timothy  Negative    12. Cocklebur  Negative    13. Burweed Marshelder  Negative    14. Ragweed, short  Negative    15.  Ragweed, Giant  Negative    16. Plantain,  English  Negative    17. Lamb's Quarters  Negative    18. Sheep Sorrell  Negative    19. Rough Pigweed  Negative    20. Marsh Elder, Rough  Negative    21. Mugwort, Common  Negative    22. Ash mix  Negative    23. Birch mix  Negative    24. Beech American  Negative    25. Box, Elder  Negative    26. Cedar, red  Negative    27. Cottonwood, Guinea-Bissau  Negative    28. Elm mix  Negative    29. Hickory mix  Negative    30. Maple mix  Negative    32. Pecan Pollen  Negative    33. Pine mix  Negative    34. Sycamore Eastern  Negative    35. Walnut, Black Pollen  Negative    36. Alternaria alternata  Negative    37. Cladosporium Herbarum  Negative    38. Aspergillus mix  Negative    39. Penicillium mix  Negative    40. Bipolaris sorokiniana (Helminthosporium)  Negative    41. Drechslera spicifera (Curvularia)  Negative    42. Mucor plumbeus  Negative    43. Fusarium moniliforme  Negative    44. Aureobasidium pullulans (pullulara)  Negative    45. Rhizopus oryzae  Negative    46. Botrytis cinera  Negative    47. Epicoccum nigrum  Negative    48. Phoma betae  Negative    49. Candida Albicans  Negative    50. Trichophyton mentagrophytes  Negative    51. Mite, D Farinae  5,000 AU/ml  Negative    52. Mite, D Pteronyssinus  5,000 AU/ml  Negative    53. Cat Hair 10,000 BAU/ml  Negative    54.  Dog Epithelia  Negative    55. Mixed Feathers  Negative    56. Horse Epithelia  Negative    57. Cockroach, German  Negative    58. Mouse  Negative    59. Tobacco Leaf  Negative     Food Perc - 05/31/18 1400    Time Antigen Placed  1400    Allergen Manufacturer  Waynette Buttery    Location  Back    Number of allergen test  10    Food  Select    1. Peanut  Negative    2. Soybean food  Negative    3. Wheat, whole  Negative    4. Sesame  Negative    5. Milk, cow  Negative    6. Egg White, chicken  Negative    7. Casein  Negative    8. Shellfish mix  Negative     9. Fish mix  Negative    10. Cashew  Negative     Intradermal - 05/31/18 1441    Time Antigen Placed  0240    Allergen Manufacturer  Waynette Buttery    Location  Arm    Number of Test  15    Intradermal  Select    Control  Negative  [place]  Negative    Johnson  Negative    7 Grass  Negative    Ragweed mix  Negative    Weed mix  Negative    Tree mix  Negative    Mold 1  Negative    Mold 2  Negative    Mold 3  Negative    Mold 4  Negative    Cat  Negative    Dog  Negative    Cockroach  Negative    Mite mix  Negative       Past Medical History: Patient Active Problem List   Diagnosis Date Noted  . Chronic rhinitis 05/31/2018  . Food intolerance 05/31/2018  . Frequent sinus infections 05/31/2018  . Dysuria 05/11/2018  . Abnormal urinalysis 05/11/2018  . Acute recurrent pansinusitis 03/11/2018  . ETD (Eustachian tube dysfunction), bilateral 03/11/2018  . Conductive hearing loss, bilateral 03/11/2018  . Cough 07/07/2017  . Sinusitis, acute, maxillary 09/22/2016  . HLD (hyperlipidemia) 02/20/2016  . h/o Sinusitis, chronic 02/20/2016  . Personal history of gout 02/20/2016  . Routine general medical examination at a health care facility 02/20/2016  . Screening for colon cancer 02/20/2016  . Environmental and seasonal allergies 10/12/2013   Past Medical History:  Diagnosis Date  . Allergy   . Anxiety   . Recurrent upper respiratory infection (URI)    Past Surgical History: Past Surgical History:  Procedure Laterality Date  . CESAREAN SECTION    . NASAL SINUS SURGERY     Medication List:  Current Outpatient Medications  Medication Sig Dispense Refill  . aspirin 81 MG chewable tablet Chew 1 tablet (81 mg total) by mouth daily. 30 tablet 1  . montelukast (SINGULAIR) 10 MG tablet Take 1 tablet (10 mg total) by mouth at bedtime. 90 tablet 0  . Fluticasone Propionate (XHANCE) 93 MCG/ACT EXHU Place 2 sprays into the nose daily. 32 mL 5   No current facility-administered  medications for this visit.    Allergies: Allergies  Allergen Reactions  . Codeine    Social History: Social History   Socioeconomic History  . Marital status: Divorced    Spouse name: n/a  . Number of children: 2  . Years of education: Not on file  . Highest education level: Not on file  Occupational History  . Occupation: Magazine features editor: [employer]    Comment: [employer] ([employer])  Social Needs  . Financial resource strain: Not on file  . Food insecurity:    Worry: Not on file    Inability: Not on file  . Transportation needs:    Medical: Not on file    Non-medical: Not on file  Tobacco Use  . Smoking status: Former Smoker    Years: 2.00  . Smokeless tobacco: Never Used  Substance and Sexual Activity  . Alcohol use: Yes    Alcohol/week: 3.0 standard drinks    Types: 3 Glasses of wine per week  . Drug use: No  . Sexual activity: Yes    Birth control/protection: Post-menopausal  Lifestyle  . Physical activity:    Days per week: Not on file    Minutes per session: Not on file  . Stress: Not on file  Relationships  . Social connections:    Talks on phone: Not on file    Gets together: Not on file    Attends religious service: Not on file    Active member of club or organization: Not on file  Attends meetings of clubs or organizations: Not on file    Relationship status: Not on file  Other Topics Concern  . Not on file  Social History Narrative   Lives with her two children.   Lives in a 73 year old home. Smoking: denies Occupation: Adult nurse HistorySurveyor, minerals in the house: no Carpet in the family room: no Carpet in the bedroom: no Heating: gas and electric Cooling: central Pet: yes 1 dog x 15 yrs, 1 cat x 17 yrs, 1 parakeet x 10 yrs Cat and dog goes into bedroom  Family History: Family History  Problem Relation Age of Onset  . Cancer Mother        oat cell carcinoma-metastatic  . Heart disease  Sister   . Drug abuse Sister   . COPD Father        emphysema  . Hyperlipidemia Father   . Stroke Paternal Grandmother    Problem                               Relation Asthma                                   No  Eczema                                Daughter   Food allergy                          No  Allergic rhino conjunctivitis     No   Review of Systems  Constitutional: Negative for appetite change, chills, fever and unexpected weight change.  HENT: Positive for congestion. Negative for rhinorrhea.   Eyes: Negative for itching.  Respiratory: Negative for cough, chest tightness, shortness of breath and wheezing.   Cardiovascular: Negative for chest pain.  Gastrointestinal: Negative for abdominal pain.  Genitourinary: Negative for difficulty urinating.  Skin: Negative for rash.  Allergic/Immunologic: Positive for environmental allergies. Negative for food allergies.  Neurological: Negative for headaches.   Objective: BP 124/72 (BP Location: Left Arm, Patient Position: Sitting, Cuff Size: Normal)   Pulse 88   Temp 97.9 F (36.6 C) (Oral)   Resp 16   Ht 4' 10.5" (1.486 m)   Wt 98 lb (44.5 kg)   SpO2 98%   BMI 20.13 kg/m  Body mass index is 20.13 kg/m. Physical Exam  Constitutional: She is oriented to person, place, and time. She appears well-developed and well-nourished.  HENT:  Head: Normocephalic and atraumatic.  Right Ear: External ear normal.  Left Ear: External ear normal.  Nose: Nose normal.  Mouth/Throat: Oropharynx is clear and moist.  Eyes: Conjunctivae and EOM are normal.  Neck: Neck supple.  Cardiovascular: Normal rate, regular rhythm and normal heart sounds. Exam reveals no gallop and no friction rub.  No murmur heard. Pulmonary/Chest: Effort normal and breath sounds normal. She has no wheezes. She has no rales.  Abdominal: Soft. Bowel sounds are normal. There is no tenderness.  Lymphadenopathy:    She has no cervical adenopathy.  Neurological: She  is alert and oriented to person, place, and time.  Skin: Skin is warm. No rash noted.  Psychiatric: She has a normal mood and affect. Her behavior is normal.  Nursing note and  vitals reviewed.  The plan was reviewed with the patient/family, and all questions/concerned were addressed.  It was my pleasure to see Jessica Bailey today and participate in her care. Please feel free to contact me with any questions or concerns.  Sincerely,  Wyline Mood, DO Allergy & Immunology  [facility] office: [phone] [facility] [phone]

## 2018-05-31 NOTE — Patient Instructions (Addendum)
Chronic rhinitis Perennial rhinitis symptoms with frequent sinus infections for the past 30 years and worsening in the fall. No previous allergy testing. Sinus surgery in the 1990s with some benefit. No recent ENT evaluation.  Today's skin testing showed: negative to environmental allergies.   Stop Singulair   Start Xhance 2 sprays daily. Demonstrated proper use. Stop Flonase.   Refer to ENT.  Food intolerance GI issues with dairy and gluten products. Tolerated limited lactose free milk.  Today's skin testing showed: negative to foods including dairy and wheat.  Based on clinical symptoms she most likely had lactose intolerance. Continue with lactose free dairy products.  Avoid gluten products.   Frequent sinus infections 2-3 sinus infections per year. Some benefit from sinus surgery in 1990s. No recent ENT evaluation.  Today's testing was negative to environmental allergies.  Refer to ENT.  If ENT evaluation is negative and sinus infections still persistent then consider basic immune evaluation.  Return in about 3 months (around 08/31/2018).

## 2018-05-31 NOTE — Assessment & Plan Note (Signed)
2-3 sinus infections per year. Some benefit from sinus surgery in 1990s. No recent ENT evaluation.  Today's testing was negative to environmental allergies.  Refer to ENT.  If ENT evaluation is negative and sinus infections still persistent then consider basic immune evaluation.

## 2018-06-02 ENCOUNTER — Telehealth: Payer: Self-pay

## 2018-06-02 NOTE — Telephone Encounter (Signed)
-----   Message from Dub MikesAshley N Hicks, LPN sent at 01/02/725311/06/2018  9:15 AM EST ----- Can you please refer patient to ENT, Dr Suszanne Connerseoh for recurring sinus infections. Thank You

## 2018-06-02 NOTE — Telephone Encounter (Signed)
Noted. Thank You.

## 2018-06-02 NOTE — Telephone Encounter (Signed)
Referral has been placed in proficient to  Dr Luther Hearingeohs office.

## 2018-06-08 ENCOUNTER — Encounter: Payer: Self-pay | Admitting: Family Medicine

## 2018-06-08 ENCOUNTER — Ambulatory Visit (INDEPENDENT_AMBULATORY_CARE_PROVIDER_SITE_OTHER): Payer: BC Managed Care – PPO | Admitting: Family Medicine

## 2018-06-08 VITALS — BP 112/72 | HR 88 | Temp 98.3°F | Ht 59.0 in | Wt 103.0 lb

## 2018-06-08 DIAGNOSIS — Z0001 Encounter for general adult medical examination with abnormal findings: Secondary | ICD-10-CM | POA: Diagnosis not present

## 2018-06-08 DIAGNOSIS — Z0100 Encounter for examination of eyes and vision without abnormal findings: Secondary | ICD-10-CM

## 2018-06-08 DIAGNOSIS — H6983 Other specified disorders of Eustachian tube, bilateral: Secondary | ICD-10-CM

## 2018-06-08 DIAGNOSIS — J309 Allergic rhinitis, unspecified: Secondary | ICD-10-CM | POA: Diagnosis not present

## 2018-06-08 DIAGNOSIS — J324 Chronic pansinusitis: Secondary | ICD-10-CM | POA: Insufficient documentation

## 2018-06-08 DIAGNOSIS — J0141 Acute recurrent pansinusitis: Secondary | ICD-10-CM

## 2018-06-08 DIAGNOSIS — Z1159 Encounter for screening for other viral diseases: Secondary | ICD-10-CM | POA: Diagnosis not present

## 2018-06-08 DIAGNOSIS — E785 Hyperlipidemia, unspecified: Secondary | ICD-10-CM

## 2018-06-08 DIAGNOSIS — J3089 Other allergic rhinitis: Secondary | ICD-10-CM

## 2018-06-08 DIAGNOSIS — Z114 Encounter for screening for human immunodeficiency virus [HIV]: Secondary | ICD-10-CM | POA: Diagnosis not present

## 2018-06-08 MED ORDER — DEXAMETHASONE SODIUM PHOSPHATE 4 MG/ML IJ SOLN
4.0000 mg | Freq: Once | INTRAMUSCULAR | Status: AC
  Administered 2018-06-08: 4 mg via INTRAMUSCULAR

## 2018-06-08 MED ORDER — FLUTICASONE PROPIONATE 93 MCG/ACT NA EXHU: 1.0000 | INHALANT_SUSPENSION | Freq: Two times a day (BID) | NASAL | 5 refills | Status: AC

## 2018-06-08 MED ORDER — PREDNISONE 20 MG PO TABS: ORAL_TABLET | ORAL | 0 refills | Status: DC

## 2018-06-08 MED ORDER — METHYLPREDNISOLONE ACETATE 40 MG/ML IJ SUSP
40.0000 mg | Freq: Once | INTRAMUSCULAR | Status: AC
  Administered 2018-06-08: 40 mg via INTRAMUSCULAR

## 2018-06-08 NOTE — Progress Notes (Signed)
Impression and Recommendations:    1. Encounter for routine adult physical exam with abnormal findings   2. Encounter for vision screening   3. Encounter for screening for HIV   4. Need for hepatitis C screening test   5. Hyperlipidemia, unspecified hyperlipidemia type   6. Acute ( on chronic ) recurrent pansinusitis   7. Chronic pansinusitis   8. Allergic rhinitis, unspecified seasonality, unspecified trigger   9. Environmental and seasonal allergies   10. ETD (Eustachian tube dysfunction), bilateral    1. FEMALE PHYSICAL 1) Anticipatory Guidance: Discussed importance of wearing a seatbelt while driving, not texting while driving; sunscreen when outside along with yearly skin surveillance; eating a well balanced and modest diet; physical activity at least 25 minutes per day or 150 min/ week of moderate to intense activity.  - Reviewed prudent habits for self-skin screening; discussed importance of searching for spots that are changing in color, symmetry, shape, etc.  2) Immunizations / Screenings / Labs:  All immunizations and screenings that patient agrees to, are up-to-date per recommendations or will be updated today.  Patient understands the needs for q 31mo dental and yearly vision screens which pt will schedule independently. Obtain CBC, CMP, HgA1c, Lipid panel, TSH and vit D when fasting if not already done recently.   - Need for shingles vaccine.  Info given and she will discuss with her insurance if she can get it here versus needs to get it at a particular pharmacy etc.    - Mammogram up to date Nov/Dec 2018, last done at [facility]; per patient, obtains next in January 2020.  Encouraged patient to continue following up with OBGYN Carrington Clamp.  - Colonoscopy last done April 2019, repeat in 5-10 years.  Reviewed that patient should check with GI regarding her next recommended follow-up. - Reviewed not to use home stool kits if she is experiencing active  hemorrhoids.  - Discussed importance of remaining up to date on pap smear and reviewed recommendations with patient today.  Advised patient to continue following up with OBGYN.  3) Weight & BMI Counseling - Improve nutrient density of diet through increasing intake of fruits and vegetables and decreasing saturated/trans fats, white flour products and refined sugar products.   American Heart Association guidelines for healthy diet, basically Mediterranean diet, and exercise guidelines of 30 minutes 5 days per week or more discussed in detail.  Health counseling performed.  All questions answered.  4) Lifestyle & Preventative Health Maintenance - Advised patient to continue working toward exercising to improve overall mental, physical, and emotional health.    - Reviewed the "spokes of the wheel" of mood and health management.  Stressed the importance of ongoing prudent habits, including regular exercise, appropriate sleep hygiene, healthful dietary habits, and prayer/meditation to relax.  - Encouraged patient to engage in daily physical activity, especially a formal exercise routine.  Recommended that the patient increase the intensity of her exercise, and eventually strive for at least 150 minutes of moderate cardiovascular activity per week according to guidelines established by the [facility].   - Healthy dietary habits encouraged, including low-carb, and high amounts of lean protein in diet.   - Patient should also consume adequate amounts of water.  2. ACUTE ON CHRONIC SINUSITIS - Advised patient that Flonase can cause tissue in the nose to become friable, and resulting nosebleeds.  Discontinue use of Flonase while taking oral steroids, take judiciously thereafter.     -She will follow-up with her allergist  Dr. Selena Batten whom she recently went to as well as ENT Dr Suszanne Conners whom the allergist sent her to.  -Advised the patient to continue using AYR or Neilmed sinus rinses BID.  Advised that the patient  may also incorporate allegra or claritin PRN.   - Continue montelukast as prescribed.  - Steroids prescribed today after risk benefits discussed with patient.  She is tolerated them well in the past and they have worked extremely well for her.  Patient requests again same treatment today.  Per patient, last visit, had injection followed by oral course of steroids.  - Reviewed prudent use of antibiotics and importance of avoiding overuse.  - Patient knows that if she develops fevers of 100.5 or more, one-sided face pain, jaw pain, or other symptoms reviewed, she should return for follow-up evaluation and possible antibiotic treatment.    - Per patient, if needed in the future, amoxicillin has worked for her in the past.  Follow-Up - Prescriptions provided and refilled today if needed. - Re-check fasting lab work yrly basis. - Return for specialist care as established and recommended by them. - Otherwise, continue to return for CPE and chronic follow-up as scheduled on yrly basis and for chornic care in 2mo.   - Patient knows to call in if desired to address acute concerns.   Meds ordered this encounter  Medications  . Fluticasone Propionate (XHANCE) 93 MCG/ACT EXHU    Sig: Place 1 spray into the nose 2 (two) times daily. After sinus rinses    Dispense:  32 mL    Refill:  5    [phone] (H)  . predniSONE (DELTASONE) 20 MG tablet    Sig: Take 3 tabs po * 2 days, then 2 tabs for 2 d, then 1 tab 2 d, then 1/2 tab 2 days.    Dispense:  15 tablet    Refill:  0  . methylPREDNISolone acetate (DEPO-MEDROL) injection 40 mg  . dexamethasone (DECADRON) injection 4 mg    Orders Placed This Encounter  Procedures  . CBC with Differential/Platelet  . Comprehensive metabolic panel  . Hemoglobin A1c  . Lipid panel  . T4, free  . TSH  . VITAMIN D 25 Hydroxy (Vit-D Deficiency, Fractures)  . T4, free  . HIV antibody (with reflex)  . Hepatitis C Antibody  . Visual acuity screening     Gross side effects, risk and benefits, and alternatives of medications discussed with patient.  Patient is aware that all medications have potential side effects and we are unable to predict every side effect or drug-drug interaction that may occur.  Expresses verbal understanding and consents to current therapy plan and treatment regimen.  F-up preventative CPE in 1 year. F/up sooner for chronic care management as discussed and/or prn.  Please see orders placed and AVS handed out to patient at the end of our visit for further patient instructions/ counseling done pertaining to today's office visit.   This document serves as a record of services personally performed by Thomasene Lot, DO. It was created on her behalf by Peggye Fothergill, a trained medical scribe. The creation of this record is based on the scribe's personal observations and the provider's statements to them.   I have reviewed the above medical documentation for accuracy and completeness and I concur.  Thomasene Lot, DO, D.O. 06/08/2018 9:05 AM        Subjective:    Chief Complaint  Patient presents with  . Annual Exam   CC: Acute on  chronic sinus symptoms x 7 days- getting W/ NI  HPI: Jessica Bailey is a 57 y.o. female who presents to [facility] today a yearly health maintenance exam.  Health Maintenance Summary Reviewed and updated, unless pt declines services.  Colonoscopy:  Last done 11/02/2017; repeat 5-10 years. Patient states that she does have hemorrhoids time to time. Tobacco History Reviewed:   Y; former smoker.  Smoked in college, 1986, 2-3 years at 0.5 ppd. Alcohol:   No concerns, no excessive use. Exercise Habits: Notes she's "just walking" for exercise. Has been walking 30 minutes 5 times weekly. Plans to go skiing with her boyfriend in [place] around Christmas. STD concerns:   None; notes monogamous with boyfriend. Drug Use:   None. Birth control  method:   N/a. Menses regular:   No concerns. Last pap smear was normal, HPV negative in 2018. Patient has had negative smears for years and years. Lumps or breast concerns:  No. Last mammogram November/December 2018. Breast Cancer Family History:  Has an aunt who had breast cancer; father's sister. Bone/ DEXA scan:  Denies family history of osteoporosis or history of bone fracture.  OBGYN Follow-Up Denies ovarian, cervical, uterine cancers. Follows up with OBGYN Carrington Clamp at [facility].  Visual/Eye Health Notes that it's been about 10 years since her last visual exam.  Dental Health Goes to the dentist every six months.  Dermatological Health Has not visited dermatology recently.  Lifestyle Habits States she is trying to drink five water bottles per day.  Notes "at least two or three."  Comments "I'm dehydrated, I feel like."  HPI CC: Sinus Infection Concerns Over the past week has had mucus and sinus concerns. Went to the allergist recently for follow-up.   Patient was referred by allergist to ENT Dr. Suszanne Conners.  Notes she feels she may currently have a sinus infection. Has had sx for a week; headache, congestion, sinus pressure & pain. Feels "It's just everywhere" when asked if pain is one-sided. Denies coughing, unless she just flushed her sinuses.  Notes "it's just not draining," even with sinus rinses. States "huge clumps of brownish red" coming out. Using saline spray and Flonase.  Doesn't feel she's actually running a fever, but at times, feels hot and then cold. Not a true fever, "but my head feels hot, if that makes sense." Feels she needs help because she has been feeling dizzy and nauseous.  She does not use allegra or zyrtec; continues using montelukast. She feels this has been helping alleviate her symptoms.   Immunization History  Administered Date(s) Administered  . Influenza Inj Mdck Quad Pf 04/16/2018  . Influenza,inj,Quad PF,6+ Mos 05/03/2017   . Influenza-Unspecified 05/21/2013, 04/20/2016, 05/03/2017, 04/16/2018  . Tdap 04/20/2013    Health Maintenance  Topic Date Due  . Hepatitis C Screening  03/12/2019 (Originally 05-29-1961)  . HIV Screening  03/12/2019 (Originally 08/15/1975)  . MAMMOGRAM  06/09/2019 (Originally 05/02/2018)  . PAP SMEAR  06/05/2020  . TETANUS/TDAP  04/21/2023  . COLONOSCOPY  11/03/2027  . INFLUENZA VACCINE  Completed     Wt Readings from Last 3 Encounters:  06/08/18 103 lb (46.7 kg)  05/31/18 98 lb (44.5 kg)  05/11/18 99 lb 14.4 oz (45.3 kg)   BP Readings from Last 3 Encounters:  06/08/18 112/72  05/31/18 124/72  05/11/18 106/68   Pulse Readings from Last 3 Encounters:  06/08/18 88  05/31/18 88  05/11/18 83     Past Medical History:  Diagnosis Date  .  Allergy   . Anxiety   . Recurrent upper respiratory infection (URI)       Past Surgical History:  Procedure Laterality Date  . CESAREAN SECTION    . NASAL SINUS SURGERY        Family History  Problem Relation Age of Onset  . Cancer Mother        oat cell carcinoma-metastatic  . Heart disease Sister   . Drug abuse Sister   . COPD Father        emphysema  . Hyperlipidemia Father   . Stroke Paternal Grandmother       Social History   Substance and Sexual Activity  Drug Use No  ,   Social History   Substance and Sexual Activity  Alcohol Use Yes  . Alcohol/week: 3.0 standard drinks  . Types: 3 Glasses of wine per week  ,   Social History   Tobacco Use  Smoking Status Former Smoker  . Years: 2.00  Smokeless Tobacco Never Used  ,   Social History   Substance and Sexual Activity  Sexual Activity Yes  . Birth control/protection: Post-menopausal    Current Outpatient Medications on File Prior to Visit  Medication Sig Dispense Refill  . aspirin 81 MG chewable tablet Chew 1 tablet (81 mg total) by mouth daily. 30 tablet 1  . montelukast (SINGULAIR) 10 MG tablet Take 1 tablet (10 mg total) by mouth at  bedtime. 90 tablet 0   No current facility-administered medications on file prior to visit.     Allergies: Codeine  Review of Systems: General:   Denies fever, chills, unexplained weight loss.  Optho/Auditory:   Denies visual changes, blurred vision/LOV Respiratory:   Denies SOB, DOE more than baseline levels.  Cardiovascular:   Denies chest pain, palpitations, new onset peripheral edema  Gastrointestinal:   Denies nausea, vomiting, diarrhea.  Genitourinary: Denies dysuria, freq/ urgency, flank pain or discharge from genitals.  Endocrine:     Denies hot or cold intolerance, polyuria, polydipsia. Musculoskeletal:   Denies unexplained myalgias, joint swelling, unexplained arthralgias, gait problems.  Skin:  Denies rash, suspicious lesions Neurological:     Denies dizziness, unexplained weakness, numbness  Psychiatric/Behavioral:   Denies mood changes, suicidal or homicidal ideations, hallucinations    Objective:    Blood pressure 112/72, pulse 88, temperature 98.3 F (36.8 C), height 4\' 11"  (1.499 m), weight 103 lb (46.7 kg), SpO2 99 %. Body mass index is 20.8 kg/m. General Appearance:    Alert, cooperative, no distress, appears stated age  Head:    Normocephalic, without obvious abnormality, atraumatic  Eyes:    PERRL, conjunctiva/corneas clear, EOM's intact, fundi    benign, both eyes  Ears:   TM's slightly bulging bilaterally, with R worse than L, no air-fluid levels. External ear canals normal, both ears.  Nose:  Nares with bilateral nasal concha swollen, slightly pale, mucosa with areas of excoriated skin/surfaces looks friable in certain areas.  Septum midline, no drainage.  TTP all sinuses globally.  Throat:  Oropharynx slightly erythematous.  Lips w/o lesion, mucosa moist, and tongue normal; teeth and gums normal  Neck:   Supple, symmetrical, trachea midline, no adenopathy;    thyroid:  no enlargement/tenderness/nodules; no carotid   bruit or JVD  Back:     Symmetric, no  curvature, ROM normal, no CVA tenderness  Lungs:     Clear to auscultation bilaterally, respirations unlabored, no       Wh/ R/ R  Chest Wall:  No tenderness or gross deformity; normal excursion   Heart:    Regular rate and rhythm, S1 and S2 normal, no murmur, rub   or gallop  Breast Exam:    Deferred to OBGYN.  Abdomen:     Soft, non-tender, bowel sounds active all four quadrants, NO   G/R/R, no masses, no organomegaly  Genitalia:    Deferred to OBGYN.  Rectal:    Deferred to OBGYN.  Extremities:   Extremities normal, atraumatic, no cyanosis or gross edema  Pulses:   2+ and symmetric all extremities  Skin:   Warm, dry, Skin color, texture, turgor normal, no obvious rashes or lesions Psych: No HI/SI, judgement and insight good, Euthymic mood. Full Affect.  Neurologic:   CNII-XII intact, normal strength, sensation and reflexes    Throughout

## 2018-06-08 NOTE — Patient Instructions (Signed)
Preventive Care for Adults, Female  A healthy lifestyle and preventive care can promote health and wellness. Preventive health guidelines for women include the following key practices.   A routine yearly physical is a good way to check with your health care provider about your health and preventive screening. It is a chance to share any concerns and updates on your health and to receive a thorough exam.   Visit your dentist for a routine exam and preventive care every 6 months. Brush your teeth twice a day and floss once a day. Good oral hygiene prevents tooth decay and gum disease.   The frequency of eye exams is based on your age, health, family medical history, use of contact lenses, and other factors. Follow your health care provider's recommendations for frequency of eye exams.   Eat a healthy diet. Foods like vegetables, fruits, whole grains, low-fat dairy products, and lean protein foods contain the nutrients you need without too many calories. Decrease your intake of foods high in solid fats, added sugars, and salt. Eat the right amount of calories for you.Get information about a proper diet from your health care provider, if necessary.   Regular physical exercise is one of the most important things you can do for your health. Most adults should get at least 150 minutes of moderate-intensity exercise (any activity that increases your heart rate and causes you to sweat) each week. In addition, most adults need muscle-strengthening exercises on 2 or more days a week.   Maintain a healthy weight. The body mass index (BMI) is a screening tool to identify possible weight problems. It provides an estimate of body fat based on height and weight. Your health care provider can find your BMI, and can help you achieve or maintain a healthy weight.For adults 20 years and older:   - A BMI below 18.5 is considered underweight.   - A BMI of 18.5 to 24.9 is normal.   - A BMI of 25 to 29.9 is  considered overweight.   - A BMI of 30 and above is considered obese.   Maintain normal blood lipids and cholesterol levels by exercising and minimizing your intake of trans and saturated fats.  Eat a balanced diet with plenty of fruit and vegetables. Blood tests for lipids and cholesterol should begin at age 20 and be repeated every 5 years minimum.  If your lipid or cholesterol levels are high, you are over 40, or you are at high risk for heart disease, you may need your cholesterol levels checked more frequently.Ongoing high lipid and cholesterol levels should be treated with medicines if diet and exercise are not working.   If you smoke, find out from your health care provider how to quit. If you do not use tobacco, do not start.   Lung cancer screening is recommended for adults aged 55-80 years who are at high risk for developing lung cancer because of a history of smoking. A yearly low-dose CT scan of the lungs is recommended for people who have at least a 30-pack-year history of smoking and are a current smoker or have quit within the past 15 years. A pack year of smoking is smoking an average of 1 pack of cigarettes a day for 1 year (for example: 1 pack a day for 30 years or 2 packs a day for 15 years). Yearly screening should continue until the smoker has stopped smoking for at least 15 years. Yearly screening should be stopped for people who develop a   health problem that would prevent them from having lung cancer treatment.   If you are pregnant, do not drink alcohol. If you are breastfeeding, be very cautious about drinking alcohol. If you are not pregnant and choose to drink alcohol, do not have more than 1 drink per day. One drink is considered to be 12 ounces (355 mL) of beer, 5 ounces (148 mL) of wine, or 1.5 ounces (44 mL) of liquor.   Avoid use of street drugs. Do not share needles with anyone. Ask for help if you need support or instructions about stopping the use of  drugs.   High blood pressure causes heart disease and increases the risk of stroke. Your blood pressure should be checked at least yearly.  Ongoing high blood pressure should be treated with medicines if weight loss and exercise do not work.   If you are 69-55 years old, ask your health care provider if you should take aspirin to prevent strokes.   Diabetes screening involves taking a blood sample to check your fasting blood sugar level. This should be done once every 3 years, after age 38, if you are within normal weight and without risk factors for diabetes. Testing should be considered at a younger age or be carried out more frequently if you are overweight and have at least 1 risk factor for diabetes.   Breast cancer screening is essential preventive care for women. You should practice "breast self-awareness."  This means understanding the normal appearance and feel of your breasts and may include breast self-examination.  Any changes detected, no matter how small, should be reported to a health care provider.  Women in their 80s and 30s should have a clinical breast exam (CBE) by a health care provider as part of a regular health exam every 1 to 3 years.  After age 66, women should have a CBE every year.  Starting at age 1, women should consider having a mammogram (breast X-ray test) every year.  Women who have a family history of breast cancer should talk to their health care provider about genetic screening.  Women at a high risk of breast cancer should talk to their health care providers about having an MRI and a mammogram every year.   -Breast cancer gene (BRCA)-related cancer risk assessment is recommended for women who have family members with BRCA-related cancers. BRCA-related cancers include breast, ovarian, tubal, and peritoneal cancers. Having family members with these cancers may be associated with an increased risk for harmful changes (mutations) in the breast cancer genes BRCA1 and  BRCA2. Results of the assessment will determine the need for genetic counseling and BRCA1 and BRCA2 testing.   The Pap test is a screening test for cervical cancer. A Pap test can show cell changes on the cervix that might become cervical cancer if left untreated. A Pap test is a procedure in which cells are obtained and examined from the lower end of the uterus (cervix).   - Women should have a Pap test starting at age 57.   - Between ages 90 and 70, Pap tests should be repeated every 2 years.   - Beginning at age 63, you should have a Pap test every 3 years as long as the past 3 Pap tests have been normal.   - Some women have medical problems that increase the chance of getting cervical cancer. Talk to your health care provider about these problems. It is especially important to talk to your health care provider if a  new problem develops soon after your last Pap test. In these cases, your health care provider may recommend more frequent screening and Pap tests.   - The above recommendations are the same for women who have or have not gotten the vaccine for human papillomavirus (HPV).   - If you had a hysterectomy for a problem that was not cancer or a condition that could lead to cancer, then you no longer need Pap tests. Even if you no longer need a Pap test, a regular exam is a good idea to make sure no other problems are starting.   - If you are between ages 36 and 66 years, and you have had normal Pap tests going back 10 years, you no longer need Pap tests. Even if you no longer need a Pap test, a regular exam is a good idea to make sure no other problems are starting.   - If you have had past treatment for cervical cancer or a condition that could lead to cancer, you need Pap tests and screening for cancer for at least 20 years after your treatment.   - If Pap tests have been discontinued, risk factors (such as a new sexual partner) need to be reassessed to determine if screening should  be resumed.   - The HPV test is an additional test that may be used for cervical cancer screening. The HPV test looks for the virus that can cause the cell changes on the cervix. The cells collected during the Pap test can be tested for HPV. The HPV test could be used to screen women aged 70 years and older, and should be used in women of any age who have unclear Pap test results. After the age of 67, women should have HPV testing at the same frequency as a Pap test.   Colorectal cancer can be detected and often prevented. Most routine colorectal cancer screening begins at the age of 57 years and continues through age 26 years. However, your health care provider may recommend screening at an earlier age if you have risk factors for colon cancer. On a yearly basis, your health care provider may provide home test kits to check for hidden blood in the stool.  Use of a small camera at the end of a tube, to directly examine the colon (sigmoidoscopy or colonoscopy), can detect the earliest forms of colorectal cancer. Talk to your health care provider about this at age 23, when routine screening begins. Direct exam of the colon should be repeated every 5 -10 years through age 49 years, unless early forms of pre-cancerous polyps or small growths are found.   People who are at an increased risk for hepatitis B should be screened for this virus. You are considered at high risk for hepatitis B if:  -You were born in a country where hepatitis B occurs often. Talk with your health care provider about which countries are considered high risk.  - Your parents were born in a high-risk country and you have not received a shot to protect against hepatitis B (hepatitis B vaccine).  - You have HIV or AIDS.  - You use needles to inject street drugs.  - You live with, or have sex with, someone who has Hepatitis B.  - You get hemodialysis treatment.  - You take certain medicines for conditions like cancer, organ  transplantation, and autoimmune conditions.   Hepatitis C blood testing is recommended for all people born from [birth year] through 1965 and any individual  with known risks for hepatitis C.   Practice safe sex. Use condoms and avoid high-risk sexual practices to reduce the spread of sexually transmitted infections (STIs). STIs include gonorrhea, chlamydia, syphilis, trichomonas, herpes, HPV, and human immunodeficiency virus (HIV). Herpes, HIV, and HPV are viral illnesses that have no cure. They can result in disability, cancer, and death. Sexually active women aged 25 years and younger should be checked for chlamydia. Older women with new or multiple partners should also be tested for chlamydia. Testing for other STIs is recommended if you are sexually active and at increased risk.   Osteoporosis is a disease in which the bones lose minerals and strength with aging. This can result in serious bone fractures or breaks. The risk of osteoporosis can be identified using a bone density scan. Women ages 65 years and over and women at risk for fractures or osteoporosis should discuss screening with their health care providers. Ask your health care provider whether you should take a calcium supplement or vitamin D to There are also several preventive steps women can take to avoid osteoporosis and resulting fractures or to keep osteoporosis from worsening. -->Recommendations include:  Eat a balanced diet high in fruits, vegetables, calcium, and vitamins.  Get enough calcium. The recommended total intake of is 1,200 mg daily; for best absorption, if taking supplements, divide doses into 250-500 mg doses throughout the day. Of the two types of calcium, calcium carbonate is best absorbed when taken with food but calcium citrate can be taken on an empty stomach.  Get enough vitamin D. NAMS and the National Osteoporosis Foundation recommend at least 1,000 IU per day for women age 50 and over who are at risk of vitamin D  deficiency. Vitamin D deficiency can be caused by inadequate sun exposure (for example, those who live in northern latitudes).  Avoid alcohol and smoking. Heavy alcohol intake (more than 7 drinks per week) increases the risk of falls and hip fracture and women smokers tend to lose bone more rapidly and have lower bone mass than nonsmokers. Stopping smoking is one of the most important changes women can make to improve their health and decrease risk for disease.  Be physically active every day. Weight-bearing exercise (for example, fast walking, hiking, jogging, and weight training) may strengthen bones or slow the rate of bone loss that comes with aging. Balancing and muscle-strengthening exercises can reduce the risk of falling and fracture.  Consider therapeutic medications. Currently, several types of effective drugs are available. Healthcare providers can recommend the type most appropriate for each woman.  Eliminate environmental factors that may contribute to accidents. Falls cause nearly 90% of all osteoporotic fractures, so reducing this risk is an important bone-health strategy. Measures include ample lighting, removing obstructions to walking, using nonskid rugs on floors, and placing mats and/or grab bars in showers.  Be aware of medication side effects. Some common medicines make bones weaker. These include a type of steroid drug called glucocorticoids used for arthritis and asthma, some antiseizure drugs, certain sleeping pills, treatments for endometriosis, and some cancer drugs. An overactive thyroid gland or using too much thyroid hormone for an underactive thyroid can also be a problem. If you are taking these medicines, talk to your doctor about what you can do to help protect your bones.reduce the rate of osteoporosis.    Menopause can be associated with physical symptoms and risks. Hormone replacement therapy is available to decrease symptoms and risks. You should talk to your  health care provider   about whether hormone replacement therapy is right for you.   Use sunscreen. Apply sunscreen liberally and repeatedly throughout the day. You should seek shade when your shadow is shorter than you. Protect yourself by wearing long sleeves, pants, a wide-brimmed hat, and sunglasses year round, whenever you are outdoors.   Once a month, do a whole body skin exam, using a mirror to look at the skin on your back. Tell your health care provider of new moles, moles that have irregular borders, moles that are larger than a pencil eraser, or moles that have changed in shape or color.   -Stay current with required vaccines (immunizations).   Influenza vaccine. All adults should be immunized every year.  Tetanus, diphtheria, and acellular pertussis (Td, Tdap) vaccine. Pregnant women should receive 1 dose of Tdap vaccine during each pregnancy. The dose should be obtained regardless of the length of time since the last dose. Immunization is preferred during the 27th 36th week of gestation. An adult who has not previously received Tdap or who does not know her vaccine status should receive 1 dose of Tdap. This initial dose should be followed by tetanus and diphtheria toxoids (Td) booster doses every 10 years. Adults with an unknown or incomplete history of completing a 3-dose immunization series with Td-containing vaccines should begin or complete a primary immunization series including a Tdap dose. Adults should receive a Td booster every 10 years.  Varicella vaccine. An adult without evidence of immunity to varicella should receive 2 doses or a second dose if she has previously received 1 dose. Pregnant females who do not have evidence of immunity should receive the first dose after pregnancy. This first dose should be obtained before leaving the health care facility. The second dose should be obtained 4 8 weeks after the first dose.  Human papillomavirus (HPV) vaccine. Females aged 13 26  years who have not received the vaccine previously should obtain the 3-dose series. The vaccine is not recommended for use in pregnant females. However, pregnancy testing is not needed before receiving a dose. If a female is found to be pregnant after receiving a dose, no treatment is needed. In that case, the remaining doses should be delayed until after the pregnancy. Immunization is recommended for any person with an immunocompromised condition through the age of 26 years if she did not get any or all doses earlier. During the 3-dose series, the second dose should be obtained 4 8 weeks after the first dose. The third dose should be obtained 24 weeks after the first dose and 16 weeks after the second dose.  Zoster vaccine. One dose is recommended for adults aged 60 years or older unless certain conditions are present.  Measles, mumps, and rubella (MMR) vaccine. Adults born before 1957 generally are considered immune to measles and mumps. Adults born in 1957 or later should have 1 or more doses of MMR vaccine unless there is a contraindication to the vaccine or there is laboratory evidence of immunity to each of the three diseases. A routine second dose of MMR vaccine should be obtained at least 28 days after the first dose for students attending postsecondary schools, health care workers, or international travelers. People who received inactivated measles vaccine or an unknown type of measles vaccine during 1963 1967 should receive 2 doses of MMR vaccine. People who received inactivated mumps vaccine or an unknown type of mumps vaccine before 1979 and are at high risk for mumps infection should consider immunization with 2 doses of   MMR vaccine. For females of childbearing age, rubella immunity should be determined. If there is no evidence of immunity, females who are not pregnant should be vaccinated. If there is no evidence of immunity, females who are pregnant should delay immunization until after pregnancy.  Unvaccinated health care workers born before [birth year] who lack laboratory evidence of measles, mumps, or rubella immunity or laboratory confirmation of disease should consider measles and mumps immunization with 2 doses of MMR vaccine or rubella immunization with 1 dose of MMR vaccine.  Pneumococcal 13-valent conjugate (PCV13) vaccine. When indicated, a person who is uncertain of her immunization history and has no record of immunization should receive the PCV13 vaccine. An adult aged 54 years or older who has certain medical conditions and has not been previously immunized should receive 1 dose of PCV13 vaccine. This PCV13 should be followed with a dose of pneumococcal polysaccharide (PPSV23) vaccine. The PPSV23 vaccine dose should be obtained at least 8 weeks after the dose of PCV13 vaccine. An adult aged 58 years or older who has certain medical conditions and previously received 1 or more doses of PPSV23 vaccine should receive 1 dose of PCV13. The PCV13 vaccine dose should be obtained 1 or more years after the last PPSV23 vaccine dose.  Pneumococcal polysaccharide (PPSV23) vaccine. When PCV13 is also indicated, PCV13 should be obtained first. All adults aged 58 years and older should be immunized. An adult younger than age 65 years who has certain medical conditions should be immunized. Any person who resides in a nursing home or long-term care facility should be immunized. An adult smoker should be immunized. People with an immunocompromised condition and certain other conditions should receive both PCV13 and PPSV23 vaccines. People with human immunodeficiency virus (HIV) infection should be immunized as soon as possible after diagnosis. Immunization during chemotherapy or radiation therapy should be avoided. Routine use of PPSV23 vaccine is not recommended for American Indians, [place] Natives, or people younger than 65 years unless there are medical conditions that require PPSV23 vaccine. When indicated,  people who have unknown immunization and have no record of immunization should receive PPSV23 vaccine. One-time revaccination 5 years after the first dose of PPSV23 is recommended for people aged 70 64 years who have chronic kidney failure, nephrotic syndrome, asplenia, or immunocompromised conditions. People who received 1 2 doses of PPSV23 before age 32 years should receive another dose of PPSV23 vaccine at age 96 years or later if at least 5 years have passed since the previous dose. Doses of PPSV23 are not needed for people immunized with PPSV23 at or after age 55 years.  Meningococcal vaccine. Adults with asplenia or persistent complement component deficiencies should receive 2 doses of quadrivalent meningococcal conjugate (MenACWY-D) vaccine. The doses should be obtained at least 2 months apart. Microbiologists working with certain meningococcal bacteria, [employer] recruits, people at risk during an outbreak, and people who travel to or live in countries with a high rate of meningitis should be immunized. A first-year college student up through age 58 years who is living in a residence hall should receive a dose if she did not receive a dose on or after her 16th birthday. Adults who have certain high-risk conditions should receive one or more doses of vaccine.  Hepatitis A vaccine. Adults who wish to be protected from this disease, have certain high-risk conditions, work with hepatitis A-infected animals, work in hepatitis A research labs, or travel to or work in countries with a high rate of hepatitis A should be  immunized. Adults who were previously unvaccinated and who anticipate close contact with an international adoptee during the first 60 days after arrival in the [place] from a country with a high rate of hepatitis A should be immunized.  Hepatitis B vaccine.  Adults who wish to be protected from this disease, have certain high-risk conditions, may be exposed to blood or other infectious  body fluids, are household contacts or sex partners of hepatitis B positive people, are clients or workers in certain care facilities, or travel to or work in countries with a high rate of hepatitis B should be immunized.  Haemophilus influenzae type b (Hib) vaccine. A previously unvaccinated person with asplenia or sickle cell disease or having a scheduled splenectomy should receive 1 dose of Hib vaccine. Regardless of previous immunization, a recipient of a hematopoietic stem cell transplant should receive a 3-dose series 6 12 months after her successful transplant. Hib vaccine is not recommended for adults with HIV infection.  Preventive Services / Frequency Ages 6 to 39years  Blood pressure check.** / Every 1 to 2 years.  Lipid and cholesterol check.** / Every 5 years beginning at age 39.  Clinical breast exam.** / Every 3 years for women in their 61s and 62s.  BRCA-related cancer risk assessment.** / For women who have family members with a BRCA-related cancer (breast, ovarian, tubal, or peritoneal cancers).  Pap test.** / Every 2 years from ages 47 through 85. Every 3 years starting at age 34 through age 12 or 74 with a history of 3 consecutive normal Pap tests.  HPV screening.** / Every 3 years from ages 46 through ages 43 to 54 with a history of 3 consecutive normal Pap tests.  Hepatitis C blood test.** / For any individual with known risks for hepatitis C.  Skin self-exam. / Monthly.  Influenza vaccine. / Every year.  Tetanus, diphtheria, and acellular pertussis (Tdap, Td) vaccine.** / Consult your health care provider. Pregnant women should receive 1 dose of Tdap vaccine during each pregnancy. 1 dose of Td every 10 years.  Varicella vaccine.** / Consult your health care provider. Pregnant females who do not have evidence of immunity should receive the first dose after pregnancy.  HPV vaccine. / 3 doses over 6 months, if 64 and younger. The vaccine is not recommended for use in  pregnant females. However, pregnancy testing is not needed before receiving a dose.  Measles, mumps, rubella (MMR) vaccine.** / You need at least 1 dose of MMR if you were born in 1957 or later. You may also need a 2nd dose. For females of childbearing age, rubella immunity should be determined. If there is no evidence of immunity, females who are not pregnant should be vaccinated. If there is no evidence of immunity, females who are pregnant should delay immunization until after pregnancy.  Pneumococcal 13-valent conjugate (PCV13) vaccine.** / Consult your health care provider.  Pneumococcal polysaccharide (PPSV23) vaccine.** / 1 to 2 doses if you smoke cigarettes or if you have certain conditions.  Meningococcal vaccine.** / 1 dose if you are age 71 to 38 years and a Market researcher living in a residence hall, or have one of several medical conditions, you need to get vaccinated against meningococcal disease. You may also need additional booster doses.  Hepatitis A vaccine.** / Consult your health care provider.  Hepatitis B vaccine.** / Consult your health care provider.  Haemophilus influenzae type b (Hib) vaccine.** / Consult your health care provider.  Ages 55 to 64years  Blood pressure check.** / Every 1 to 2 years.  Lipid and cholesterol check.** / Every 5 years beginning at age 20 years.  Lung cancer screening. / Every year if you are aged 55 80 years and have a 30-pack-year history of smoking and currently smoke or have quit within the past 15 years. Yearly screening is stopped once you have quit smoking for at least 15 years or develop a health problem that would prevent you from having lung cancer treatment.  Clinical breast exam.** / Every year after age 40 years.  BRCA-related cancer risk assessment.** / For women who have family members with a BRCA-related cancer (breast, ovarian, tubal, or peritoneal cancers).  Mammogram.** / Every year beginning at age 40  years and continuing for as long as you are in good health. Consult with your health care provider.  Pap test.** / Every 3 years starting at age 30 years through age 65 or 70 years with a history of 3 consecutive normal Pap tests.  HPV screening.** / Every 3 years from ages 30 years through ages 65 to 70 years with a history of 3 consecutive normal Pap tests.  Fecal occult blood test (FOBT) of stool. / Every year beginning at age 50 years and continuing until age 75 years. You may not need to do this test if you get a colonoscopy every 10 years.  Flexible sigmoidoscopy or colonoscopy.** / Every 5 years for a flexible sigmoidoscopy or every 10 years for a colonoscopy beginning at age 50 years and continuing until age 75 years.  Hepatitis C blood test.** / For all people born from 1945 through 1965 and any individual with known risks for hepatitis C.  Skin self-exam. / Monthly.  Influenza vaccine. / Every year.  Tetanus, diphtheria, and acellular pertussis (Tdap/Td) vaccine.** / Consult your health care provider. Pregnant women should receive 1 dose of Tdap vaccine during each pregnancy. 1 dose of Td every 10 years.  Varicella vaccine.** / Consult your health care provider. Pregnant females who do not have evidence of immunity should receive the first dose after pregnancy.  Zoster vaccine.** / 1 dose for adults aged 60 years or older.  Measles, mumps, rubella (MMR) vaccine.** / You need at least 1 dose of MMR if you were born in 1957 or later. You may also need a 2nd dose. For females of childbearing age, rubella immunity should be determined. If there is no evidence of immunity, females who are not pregnant should be vaccinated. If there is no evidence of immunity, females who are pregnant should delay immunization until after pregnancy.  Pneumococcal 13-valent conjugate (PCV13) vaccine.** / Consult your health care provider.  Pneumococcal polysaccharide (PPSV23) vaccine.** / 1 to 2 doses if  you smoke cigarettes or if you have certain conditions.  Meningococcal vaccine.** / Consult your health care provider.  Hepatitis A vaccine.** / Consult your health care provider.  Hepatitis B vaccine.** / Consult your health care provider.  Haemophilus influenzae type b (Hib) vaccine.** / Consult your health care provider.  Ages 65 years and over  Blood pressure check.** / Every 1 to 2 years.  Lipid and cholesterol check.** / Every 5 years beginning at age 20 years.  Lung cancer screening. / Every year if you are aged 55 80 years and have a 30-pack-year history of smoking and currently smoke or have quit within the past 15 years. Yearly screening is stopped once you have quit smoking for at least 15 years or develop a health problem that   would prevent you from having lung cancer treatment.  Clinical breast exam.** / Every year after age 103 years.  BRCA-related cancer risk assessment.** / For women who have family members with a BRCA-related cancer (breast, ovarian, tubal, or peritoneal cancers).  Mammogram.** / Every year beginning at age 36 years and continuing for as long as you are in good health. Consult with your health care provider.  Pap test.** / Every 3 years starting at age 5 years through age 85 or 10 years with 3 consecutive normal Pap tests. Testing can be stopped between 65 and 70 years with 3 consecutive normal Pap tests and no abnormal Pap or HPV tests in the past 10 years.  HPV screening.** / Every 3 years from ages 93 years through ages 70 or 45 years with a history of 3 consecutive normal Pap tests. Testing can be stopped between 65 and 70 years with 3 consecutive normal Pap tests and no abnormal Pap or HPV tests in the past 10 years.  Fecal occult blood test (FOBT) of stool. / Every year beginning at age 8 years and continuing until age 45 years. You may not need to do this test if you get a colonoscopy every 10 years.  Flexible sigmoidoscopy or colonoscopy.** /  Every 5 years for a flexible sigmoidoscopy or every 10 years for a colonoscopy beginning at age 69 years and continuing until age 68 years.  Hepatitis C blood test.** / For all people born from [birth year] through 1964 and any individual with known risks for hepatitis C.  Osteoporosis screening.** / A one-time screening for women ages 7 years and over and women at risk for fractures or osteoporosis.  Skin self-exam. / Monthly.  Influenza vaccine. / Every year.  Tetanus, diphtheria, and acellular pertussis (Tdap/Td) vaccine.** / 1 dose of Td every 10 years.  Varicella vaccine.** / Consult your health care provider.  Zoster vaccine.** / 1 dose for adults aged 5 years or older.  Pneumococcal 13-valent conjugate (PCV13) vaccine.** / Consult your health care provider.  Pneumococcal polysaccharide (PPSV23) vaccine.** / 1 dose for all adults aged 74 years and older.  Meningococcal vaccine.** / Consult your health care provider.  Hepatitis A vaccine.** / Consult your health care provider.  Hepatitis B vaccine.** / Consult your health care provider.  Haemophilus influenzae type b (Hib) vaccine.** / Consult your health care provider. ** Family history and personal history of risk and conditions may change your health care provider's recommendations. Document Released: 09/02/2001 Document Revised: 04/27/2013  [facility] Patient Information 2014 [place], [place].   EXERCISE AND DIET:  We recommended that you start or continue a regular exercise program for good health. Regular exercise means any activity that makes your heart beat faster and makes you sweat.  We recommend exercising at least 30 minutes per day at least 3 days a week, preferably 5.  We also recommend a diet low in fat and sugar / carbohydrates.  Inactivity, poor dietary choices and obesity can cause diabetes, heart attack, stroke, and kidney damage, among others.     ALCOHOL AND SMOKING:  Women should limit their alcohol intake to no  more than 7 drinks/beers/glasses of wine (combined, not each!) per week. Moderation of alcohol intake to this level decreases your risk of breast cancer and liver damage.  ( And of course, no recreational drugs are part of a healthy lifestyle.)  Also, you should not be smoking at all or even being exposed to second hand smoke. Most people know smoking can  cause cancer, and various heart and lung diseases, but did you know it also contributes to weakening of your bones?  Aging of your skin?  Yellowing of your teeth and nails?   CALCIUM AND VITAMIN D:  Adequate intake of calcium and Vitamin D are recommended.  The recommendations for exact amounts of these supplements seem to change often, but generally speaking 600 mg of calcium (either carbonate or citrate) and 800 units of Vitamin D per day seems prudent. Certain women may benefit from higher intake of Vitamin D.  If you are among these women, your doctor will have told you during your visit.     PAP SMEARS:  Pap smears, to check for cervical cancer or precancers,  have traditionally been done yearly, although recent scientific advances have shown that most women can have pap smears less often.  However, every woman still should have a physical exam from her gynecologist or primary care physician every year. It will include a breast check, inspection of the vulva and vagina to check for abnormal growths or skin changes, a visual exam of the cervix, and then an exam to evaluate the size and shape of the uterus and ovaries.  And after 56 years of age, a rectal exam is indicated to check for rectal cancers. We will also provide age appropriate advice regarding health maintenance, like when you should have certain vaccines, screening for sexually transmitted diseases, bone density testing, colonoscopy, mammograms, etc.    MAMMOGRAMS:  All women over 71 years old should have a yearly mammogram. Many facilities now offer a "3D" mammogram, which may cost  around $50 extra out of pocket. If possible,  we recommend you accept the option to have the 3D mammogram performed.  It both reduces the number of women who will be called back for extra views which then turn out to be normal, and it is better than the routine mammogram at detecting truly abnormal areas.     COLONOSCOPY:  Colonoscopy to screen for colon cancer is recommended for all women at age 52.  We know, you hate the idea of the prep.  We agree, BUT, having colon cancer and not knowing it is worse!!  Colon cancer so often starts as a polyp that can be seen and removed at colonscopy, which can quite literally save your life!  And if your first colonoscopy is normal and you have no family history of colon cancer, most women don't have to have it again for 10 years.  Once every ten years, you can do something that may end up saving your life, right?  We will be happy to help you get it scheduled when you are ready.  Be sure to check your insurance coverage so you understand how much it will cost.  It may be covered as a preventative service at no cost, but you should check your particular policy.

## 2018-06-09 ENCOUNTER — Encounter: Payer: Self-pay | Admitting: Family Medicine

## 2018-06-09 LAB — LIPID PANEL
CHOLESTEROL TOTAL: 265 mg/dL — AB (ref 100–199)
Chol/HDL Ratio: 2.7 ratio (ref 0.0–4.4)
HDL: 98 mg/dL (ref >39)
LDL CALC: 139 mg/dL — AB (ref 0–99)
Triglycerides: 138 mg/dL (ref 0–149)
VLDL CHOLESTEROL CAL: 28 mg/dL (ref 5–40)

## 2018-06-09 LAB — CBC WITH DIFFERENTIAL/PLATELET
BASOS: 1 %
Basophils Absolute: 0.1 10*3/uL (ref 0.0–0.2)
EOS (ABSOLUTE): 0.1 10*3/uL (ref 0.0–0.4)
EOS: 1 %
Hematocrit: 42.3 % (ref 34.0–46.6)
Hemoglobin: 14.3 g/dL (ref 11.1–15.9)
IMMATURE GRANS (ABS): 0 10*3/uL (ref 0.0–0.1)
IMMATURE GRANULOCYTES: 0 %
LYMPHS: 41 %
Lymphocytes Absolute: 2 10*3/uL (ref 0.7–3.1)
MCH: 29.1 pg (ref 26.6–33.0)
MCHC: 33.8 g/dL (ref 31.5–35.7)
MCV: 86 fL (ref 79–97)
Monocytes Absolute: 0.3 10*3/uL (ref 0.1–0.9)
Monocytes: 7 %
NEUTROS PCT: 50 %
Neutrophils Absolute: 2.5 10*3/uL (ref 1.4–7.0)
PLATELETS: 330 10*3/uL (ref 150–450)
RBC: 4.91 x10E6/uL (ref 3.77–5.28)
RDW: 12.2 % — ABNORMAL LOW (ref 12.3–15.4)
WBC: 5 10*3/uL (ref 3.4–10.8)

## 2018-06-09 LAB — COMPREHENSIVE METABOLIC PANEL
A/G RATIO: 2 (ref 1.2–2.2)
ALT: 18 IU/L (ref 0–32)
AST: 29 IU/L (ref 0–40)
Albumin: 5.2 g/dL (ref 3.5–5.5)
Alkaline Phosphatase: 71 IU/L (ref 39–117)
BUN/Creatinine Ratio: 15 (ref 9–23)
BUN: 12 mg/dL (ref 6–24)
Bilirubin Total: 0.7 mg/dL (ref 0.0–1.2)
CALCIUM: 10 mg/dL (ref 8.7–10.2)
CO2: 23 mmol/L (ref 20–29)
Chloride: 97 mmol/L (ref 96–106)
Creatinine, Ser: 0.78 mg/dL (ref 0.57–1.00)
GFR calc non Af Amer: 85 mL/min/{1.73_m2} (ref >59)
GFR, EST AFRICAN AMERICAN: 98 mL/min/{1.73_m2} (ref >59)
Globulin, Total: 2.6 g/dL (ref 1.5–4.5)
Glucose: 87 mg/dL (ref 65–99)
POTASSIUM: 4.1 mmol/L (ref 3.5–5.2)
Sodium: 137 mmol/L (ref 134–144)
TOTAL PROTEIN: 7.8 g/dL (ref 6.0–8.5)

## 2018-06-09 LAB — HIV ANTIBODY (ROUTINE TESTING W REFLEX): HIV SCREEN 4TH GENERATION: NONREACTIVE

## 2018-06-09 LAB — HEMOGLOBIN A1C
ESTIMATED AVERAGE GLUCOSE: 103 mg/dL
Hgb A1c MFr Bld: 5.2 % (ref 4.8–5.6)

## 2018-06-09 LAB — VITAMIN D 25 HYDROXY (VIT D DEFICIENCY, FRACTURES): Vit D, 25-Hydroxy: 45.6 ng/mL (ref 30.0–100.0)

## 2018-06-09 LAB — TSH: TSH: 2.06 u[IU]/mL (ref 0.450–4.500)

## 2018-06-09 LAB — HEPATITIS C ANTIBODY

## 2018-06-09 LAB — T4, FREE: FREE T4: 1.26 ng/dL (ref 0.82–1.77)

## 2018-09-01 ENCOUNTER — Ambulatory Visit: Payer: BC Managed Care – PPO | Admitting: Allergy

## 2018-10-14 ENCOUNTER — Ambulatory Visit: Payer: BC Managed Care – PPO | Admitting: Allergy

## 2018-11-19 ENCOUNTER — Ambulatory Visit
Admission: EM | Admit: 2018-11-19 | Discharge: 2018-11-19 | Disposition: A | Discharge status: Home / Self Care | Payer: BC Managed Care – PPO | Attending: Emergency Medicine | Admitting: Emergency Medicine

## 2018-11-19 ENCOUNTER — Other Ambulatory Visit: Payer: Self-pay

## 2018-11-19 DIAGNOSIS — S30861A Insect bite (nonvenomous) of abdominal wall, initial encounter: Secondary | ICD-10-CM

## 2018-11-19 DIAGNOSIS — W57XXXA Bitten or stung by nonvenomous insect and other nonvenomous arthropods, initial encounter: Secondary | ICD-10-CM | POA: Diagnosis not present

## 2018-11-19 MED ORDER — DOXYCYCLINE HYCLATE 100 MG PO CAPS: 100.0000 mg | ORAL_CAPSULE | Freq: Two times a day (BID) | ORAL | 0 refills | Status: DC

## 2018-11-19 MED ORDER — DOXYCYCLINE HYCLATE 100 MG PO CAPS: 100.0000 mg | ORAL_CAPSULE | Freq: Two times a day (BID) | ORAL | 0 refills | Status: AC

## 2018-11-19 MED ORDER — TRIAMCINOLONE ACETONIDE 0.1 % EX CREA: 1.0000 "application " | TOPICAL_CREAM | Freq: Two times a day (BID) | CUTANEOUS | 0 refills | Status: DC

## 2018-11-19 MED ORDER — TRIAMCINOLONE ACETONIDE 0.1 % EX CREA: 1.0000 "application " | TOPICAL_CREAM | Freq: Two times a day (BID) | CUTANEOUS | 0 refills | Status: AC

## 2018-11-19 NOTE — ED Triage Notes (Signed)
Pt c/o tick bite to abdomen x1wk, redness noted

## 2018-11-19 NOTE — Discharge Instructions (Addendum)
May take over the counter Zyrtec, claritin or benadryl to help with symptoms.  Topical cream provided may also be beneficial.  Warm compresses would help if there is any remaining tick imbedded in the skin.  I have sent empiric antibiotics to cover for any infectious process.  If redness has resolved completely you may stop the antibiotics.  Please return if develop fever, nausea, vomiting, rash, increased pain, drainage or otherwise worsening.

## 2018-11-19 NOTE — ED Provider Notes (Signed)
[facility]    CSN: [id number] Arrival date & time: 11/19/18  1221     History   Chief Complaint Chief Complaint  Patient presents with  . Tick Removal    HPI Jessica Bailey is a 58 y.o. female.   Jessica Bailey presents with complaints of redness to left upper abdomen at site of tick bite. Found a tick to the region 4/25, which she pulled out to remove. Just today she noticed the area is now red, she is uncertain if there is any remaining tick imbedded. Minimal pain. No itching. No fevers. No other rash. No gi symptoms. No joint or body aches. Hasn't tried any treatments for symptoms. Hx of anxiety, allergic rhinitis, gout.     ROS per HPI, negative if not otherwise mentioned.      Past Medical History:  Diagnosis Date  . Allergy   . Anxiety   . Recurrent upper respiratory infection (URI)     Patient Active Problem List   Diagnosis Date Noted  . Chronic pansinusitis 06/08/2018  . Allergic rhinitis 06/08/2018  . Chronic rhinitis 05/31/2018  . Food intolerance 05/31/2018  . Frequent sinus infections 05/31/2018  . Dysuria 05/11/2018  . Abnormal urinalysis 05/11/2018  . ETD (Eustachian tube dysfunction), bilateral 03/11/2018  . Conductive hearing loss, bilateral 03/11/2018  . Cough 07/07/2017  . Sinusitis, acute, maxillary 09/22/2016  . HLD (hyperlipidemia) 02/20/2016  . h/o Sinusitis, chronic 02/20/2016  . Personal history of gout 02/20/2016  . Routine general medical examination at a health care facility 02/20/2016  . Screening for colon cancer 02/20/2016  . Environmental and seasonal allergies 10/12/2013    Past Surgical History:  Procedure Laterality Date  . CESAREAN SECTION    . NASAL SINUS SURGERY      OB History   No obstetric history on file.      Home Medications    Prior to Admission medications   Medication Sig Start Date End Date Taking? Authorizing Provider  aspirin 81 MG chewable tablet Chew 1 tablet (81 mg total)  by mouth daily. 02/03/16   Vanetta Mulders, MD  doxycycline (VIBRAMYCIN) 100 MG capsule Take 1 capsule (100 mg total) by mouth 2 (two) times daily for 7 days. 11/19/18 11/26/18  Georgetta Haber, NP  Fluticasone Propionate (XHANCE) 93 MCG/ACT EXHU Place 1 spray into the nose 2 (two) times daily. After sinus rinses 06/08/18   Opalski, Deborah, DO  montelukast (SINGULAIR) 10 MG tablet Take 1 tablet (10 mg total) by mouth at bedtime. 03/11/18   Opalski, Gavin Pound, DO  predniSONE (DELTASONE) 20 MG tablet Take 3 tabs po * 2 days, then 2 tabs for 2 d, then 1 tab 2 d, then 1/2 tab 2 days. 06/08/18   Opalski, Gavin Pound, DO  triamcinolone cream (KENALOG) 0.1 % Apply 1 application topically 2 (two) times daily. 11/19/18   Georgetta Haber, NP    Family History Family History  Problem Relation Age of Onset  . Cancer Mother        oat cell carcinoma-metastatic  . Heart disease Sister   . Drug abuse Sister   . COPD Father        emphysema  . Hyperlipidemia Father   . Stroke Paternal Grandmother     Social History Social History   Tobacco Use  . Smoking status: Former Smoker    Years: 2.00  . Smokeless tobacco: Never Used  Substance Use Topics  . Alcohol use: Yes    Alcohol/week: 3.0 standard drinks  Types: 3 Glasses of wine per week  . Drug use: No     Allergies   Codeine   Review of Systems Review of Systems   Physical Exam Triage Vital Signs ED Triage Vitals [11/19/18 1234]  Enc Vitals Group     BP 124/87     Pulse Rate 93     Resp 18     Temp 97.9 F (36.6 C)     Temp Source Oral     SpO2 99 %     Weight      Height      Head Circumference      Peak Flow      Pain Score 0     Pain Loc      Pain Edu?      Excl. in GC?    No data found.  Updated Vital Signs BP 124/87 (BP Location: Left Arm)   Pulse 93   Temp 97.9 F (36.6 C) (Oral)   Resp 18   SpO2 99%    Physical Exam Constitutional:      General: She is not in acute distress.    Appearance: She is  well-developed.  Cardiovascular:     Rate and Rhythm: Normal rate and regular rhythm.     Heart sounds: Normal heart sounds.  Pulmonary:     Effort: Pulmonary effort is normal.     Breath sounds: Normal breath sounds.  Skin:    General: Skin is warm and dry.     Findings: Lesion present. No rash.          Comments: Approximately 1cm in diameter circular lesion to LUQ with pinpoint open skin from likely bite of tick; non tender; no fluctuance; no bulls-eye pattern; blanches; no drainage  Neurological:     Mental Status: She is alert and oriented to person, place, and time.      UC Treatments / Results  Labs (all labs ordered are listed, but only abnormal results are displayed) Labs Reviewed - No data to display  EKG None  Radiology No results found.  Procedures Procedures (including critical care time)  Medications Ordered in UC Medications - No data to display  Initial Impression / Assessment and Plan / UC Course  I have reviewed the triage vital signs and the nursing notes.  Pertinent labs & imaging results that were available during my care of the patient were reviewed by me and considered in my medical decision making (see chart for details).     No obvious remaining foreign body from tick in the skin. Likely localized reaction from bite. Delayed onset without itching. Will cover with antibiotics for concern for development of cellulitis. Return precautions provided. Patient verbalized understanding and agreeable to plan.    Final Clinical Impressions(s) / UC Diagnoses   Final diagnoses:  Tick bite, initial encounter     Discharge Instructions     May take over the counter Zyrtec, claritin or benadryl to help with symptoms.  Topical cream provided may also be beneficial.  Warm compresses would help if there is any remaining tick imbedded in the skin.  I have sent empiric antibiotics to cover for any infectious process.  If redness has resolved completely  you may stop the antibiotics.  Please return if develop fever, nausea, vomiting, rash, increased pain, drainage or otherwise worsening.     ED Prescriptions    Medication Sig Dispense Auth. Provider   doxycycline (VIBRAMYCIN) 100 MG capsule  (Status: Discontinued) Take 1 capsule (  100 mg total) by mouth 2 (two) times daily for 7 days. 20 capsule Linus Mako B, NP   triamcinolone cream (KENALOG) 0.1 %  (Status: Discontinued) Apply 1 application topically 2 (two) times daily. 30 g Linus Mako B, NP   doxycycline (VIBRAMYCIN) 100 MG capsule Take 1 capsule (100 mg total) by mouth 2 (two) times daily for 7 days. 14 capsule Linus Mako B, NP   triamcinolone cream (KENALOG) 0.1 % Apply 1 application topically 2 (two) times daily. 30 g Georgetta Haber, NP     Controlled Substance Prescriptions [place] Controlled Substance Registry consulted? Not Applicable   Georgetta Haber, NP 11/19/18 1315

## 2020-03-06 ENCOUNTER — Other Ambulatory Visit: Payer: Self-pay | Admitting: Obstetrics and Gynecology

## 2020-03-06 DIAGNOSIS — E2839 Other primary ovarian failure: Secondary | ICD-10-CM

## 2020-05-31 ENCOUNTER — Ambulatory Visit
Admission: RE | Admit: 2020-05-31 | Discharge: 2020-05-31 | Disposition: A | Discharge status: Home / Self Care | Payer: BC Managed Care – PPO | Source: Ambulatory Visit | Attending: Obstetrics and Gynecology | Admitting: Obstetrics and Gynecology

## 2020-05-31 ENCOUNTER — Other Ambulatory Visit: Payer: Self-pay

## 2020-05-31 DIAGNOSIS — E2839 Other primary ovarian failure: Secondary | ICD-10-CM

## 2021-06-25 ENCOUNTER — Ambulatory Visit
Admission: EM | Admit: 2021-06-25 | Discharge: 2021-06-25 | Disposition: A | Discharge status: Home / Self Care | Payer: BC Managed Care – PPO | Attending: Physician Assistant | Admitting: Physician Assistant

## 2021-06-25 ENCOUNTER — Other Ambulatory Visit: Payer: Self-pay

## 2021-06-25 DIAGNOSIS — J209 Acute bronchitis, unspecified: Secondary | ICD-10-CM

## 2021-06-25 MED ORDER — PREDNISONE 20 MG PO TABS: 40.0000 mg | ORAL_TABLET | Freq: Every day | ORAL | 0 refills | Status: AC

## 2021-06-25 NOTE — ED Triage Notes (Signed)
Pt c/o cough, nasal/chest congestion, and ear pressure since Friday. States taking severe cold/sinus meds.

## 2021-06-25 NOTE — ED Provider Notes (Signed)
[facility]    CSN: [id number] Arrival date & time: 06/25/21  1114      History   Chief Complaint Chief Complaint  Patient presents with   Cough    HPI Jessica Bailey is a 60 y.o. female.   Patient here today for evaluation of cough, congestion, and ear pressure that she has had for the last 5 days.  She reports that she has been taking over-the-counter medication without significant relief.  She states that congestion now feels as if it is moved into her chest and feels very similar to prior bronchitis.  The history is provided by the patient.  Cough Associated symptoms: no chills, no ear pain, no eye discharge, no fever, no shortness of breath, no sore throat and no wheezing    Past Medical History:  Diagnosis Date   Allergy    Anxiety    Recurrent upper respiratory infection (URI)     Patient Active Problem List   Diagnosis Date Noted   Chronic pansinusitis 06/08/2018   Allergic rhinitis 06/08/2018   Chronic rhinitis 05/31/2018   Food intolerance 05/31/2018   Frequent sinus infections 05/31/2018   Dysuria 05/11/2018   Abnormal urinalysis 05/11/2018   ETD (Eustachian tube dysfunction), bilateral 03/11/2018   Conductive hearing loss, bilateral 03/11/2018   Cough 07/07/2017   Sinusitis, acute, maxillary 09/22/2016   HLD (hyperlipidemia) 02/20/2016   h/o Sinusitis, chronic 02/20/2016   Personal history of gout 02/20/2016   Routine general medical examination at a health care facility 02/20/2016   Screening for colon cancer 02/20/2016   Environmental and seasonal allergies 10/12/2013    Past Surgical History:  Procedure Laterality Date   CESAREAN SECTION     NASAL SINUS SURGERY      OB History   No obstetric history on file.      Home Medications    Prior to Admission medications   Medication Sig Start Date End Date Taking? Authorizing Provider  predniSONE (DELTASONE) 20 MG tablet Take 2 tablets (40 mg total) by mouth daily with  breakfast for 5 days. 06/25/21 06/30/21 Yes Tomi Bamberger, PA-C  aspirin 81 MG chewable tablet Chew 1 tablet (81 mg total) by mouth daily. 02/03/16   Vanetta Mulders, MD  Fluticasone Propionate Timmothy Sours) 93 MCG/ACT EXHU Place 1 spray into the nose 2 (two) times daily. After sinus rinses 06/08/18   Opalski, Deborah, DO  montelukast (SINGULAIR) 10 MG tablet Take 1 tablet (10 mg total) by mouth at bedtime. 03/11/18   Opalski, Gavin Pound, DO  triamcinolone cream (KENALOG) 0.1 % Apply 1 application topically 2 (two) times daily. 11/19/18   Georgetta Haber, NP    Family History Family History  Problem Relation Age of Onset   Cancer Mother        oat cell carcinoma-metastatic   Heart disease Sister    Drug abuse Sister    COPD Father        emphysema   Hyperlipidemia Father    Stroke Paternal Grandmother     Social History Social History   Tobacco Use   Smoking status: Former    Years: 2.00    Types: Cigarettes   Smokeless tobacco: Never  Substance Use Topics   Alcohol use: Yes    Alcohol/week: 3.0 standard drinks    Types: 3 Glasses of wine per week   Drug use: No     Allergies   Codeine   Review of Systems Review of Systems  Constitutional:  Negative for chills and  fever.  HENT:  Positive for congestion. Negative for ear pain and sore throat.   Eyes:  Negative for discharge and redness.  Respiratory:  Positive for cough. Negative for shortness of breath and wheezing.   Gastrointestinal:  Negative for abdominal pain, diarrhea, nausea and vomiting.    Physical Exam Triage Vital Signs ED Triage Vitals [06/25/21 1409]  Enc Vitals Group     BP 139/88     Pulse Rate 91     Resp 18     Temp 98.4 F (36.9 C)     Temp Source Oral     SpO2 95 %     Weight      Height      Head Circumference      Peak Flow      Pain Score 10     Pain Loc      Pain Edu?      Excl. in GC?    No data found.  Updated Vital Signs BP 139/88 (BP Location: Left Arm)   Pulse 91   Temp  98.4 F (36.9 C) (Oral)   Resp 18   SpO2 95%      Physical Exam Vitals and nursing note reviewed.  Constitutional:      General: She is not in acute distress.    Appearance: Normal appearance. She is not ill-appearing.  HENT:     Head: Normocephalic and atraumatic.     Right Ear: Tympanic membrane normal.     Left Ear: Tympanic membrane normal.     Nose: Congestion present.     Mouth/Throat:     Mouth: Mucous membranes are moist.     Pharynx: No oropharyngeal exudate or posterior oropharyngeal erythema.  Eyes:     Conjunctiva/sclera: Conjunctivae normal.  Cardiovascular:     Rate and Rhythm: Normal rate and regular rhythm.     Heart sounds: Normal heart sounds. No murmur heard. Pulmonary:     Effort: Pulmonary effort is normal. No respiratory distress.     Breath sounds: Wheezing (rare) present. No rhonchi or rales.  Skin:    General: Skin is warm and dry.  Neurological:     Mental Status: She is alert.  Psychiatric:        Mood and Affect: Mood normal.        Thought Content: Thought content normal.     UC Treatments / Results  Labs (all labs ordered are listed, but only abnormal results are displayed) Labs Reviewed - No data to display  EKG   Radiology No results found.  Procedures Procedures (including critical care time)  Medications Ordered in UC Medications - No data to display  Initial Impression / Assessment and Plan / UC Course  I have reviewed the triage vital signs and the nursing notes.  Pertinent labs & imaging results that were available during my care of the patient were reviewed by me and considered in my medical decision making (see chart for details).  Prednisone prescribed for suspected bronchitis.  Recommend follow-up with any further concerns.  Final Clinical Impressions(s) / UC Diagnoses   Final diagnoses:  Acute bronchitis, unspecified organism   Discharge Instructions   None    ED Prescriptions     Medication Sig Dispense  Auth. Provider   predniSONE (DELTASONE) 20 MG tablet Take 2 tablets (40 mg total) by mouth daily with breakfast for 5 days. 10 tablet Tomi Bamberger, PA-C      PDMP not reviewed this encounter.  Tomi Bamberger, PA-C 06/25/21 1600

## 2024-02-08 ENCOUNTER — Other Ambulatory Visit: Payer: Self-pay | Admitting: Obstetrics and Gynecology

## 2024-02-08 DIAGNOSIS — R928 Other abnormal and inconclusive findings on diagnostic imaging of breast: Secondary | ICD-10-CM

## 2024-02-16 ENCOUNTER — Ambulatory Visit: Admission: RE | Admit: 2024-02-16 | Payer: Self-pay | Source: Ambulatory Visit

## 2024-02-16 ENCOUNTER — Ambulatory Visit
Admission: RE | Admit: 2024-02-16 | Discharge: 2024-02-16 | Disposition: A | Discharge status: Home / Self Care | Payer: Self-pay | Source: Ambulatory Visit | Attending: Obstetrics and Gynecology | Admitting: Obstetrics and Gynecology

## 2024-02-16 DIAGNOSIS — R928 Other abnormal and inconclusive findings on diagnostic imaging of breast: Secondary | ICD-10-CM
